# Patient Record
Sex: Female | Born: 1985
Health system: Southern US, Community
[De-identification: ages and names within clinical notes are randomized; demographics above are authoritative.]

## PROBLEM LIST (undated history)

## (undated) DIAGNOSIS — E559 Vitamin D deficiency, unspecified: Secondary | ICD-10-CM

## (undated) DIAGNOSIS — R519 Headache, unspecified: Secondary | ICD-10-CM

## (undated) DIAGNOSIS — G44011 Episodic cluster headache, intractable: Secondary | ICD-10-CM

## (undated) DIAGNOSIS — K219 Gastro-esophageal reflux disease without esophagitis: Secondary | ICD-10-CM

## (undated) DIAGNOSIS — N939 Abnormal uterine and vaginal bleeding, unspecified: Secondary | ICD-10-CM

## (undated) DIAGNOSIS — R5381 Other malaise: Secondary | ICD-10-CM

## (undated) DIAGNOSIS — N926 Irregular menstruation, unspecified: Secondary | ICD-10-CM

## (undated) DIAGNOSIS — G44311 Acute post-traumatic headache, intractable: Secondary | ICD-10-CM

## (undated) DIAGNOSIS — S73192A Other sprain of left hip, initial encounter: Secondary | ICD-10-CM

## (undated) DIAGNOSIS — G43909 Migraine, unspecified, not intractable, without status migrainosus: Secondary | ICD-10-CM

## (undated) DIAGNOSIS — I10 Essential (primary) hypertension: Secondary | ICD-10-CM

## (undated) DIAGNOSIS — R600 Localized edema: Secondary | ICD-10-CM

## (undated) DIAGNOSIS — E039 Hypothyroidism, unspecified: Secondary | ICD-10-CM

## (undated) DIAGNOSIS — R0683 Snoring: Secondary | ICD-10-CM

## (undated) DIAGNOSIS — E282 Polycystic ovarian syndrome: Secondary | ICD-10-CM

## (undated) DIAGNOSIS — M549 Dorsalgia, unspecified: Principal | ICD-10-CM

## (undated) DIAGNOSIS — G4733 Obstructive sleep apnea (adult) (pediatric): Secondary | ICD-10-CM

## (undated) HISTORY — DX: Other sprain of left hip, initial encounter: S73.192A

## (undated) HISTORY — DX: Migraine, unspecified, not intractable, without status migrainosus: G43.909

## (undated) HISTORY — DX: Essential (primary) hypertension: I10

## (undated) HISTORY — DX: Localized edema: R60.0

## (undated) HISTORY — DX: Gastro-esophageal reflux disease without esophagitis: K21.9

## (undated) HISTORY — DX: Hypothyroidism, unspecified: E03.9

## (undated) MED FILL — AMLODIPINE BESYLATE 10MG TABS: 10 MG | 30 days supply | Qty: 30 | Fill #0 | Status: AC

## (undated) MED FILL — FAMOTIDINE 20MG TABS: 20 MG | 90 days supply | Qty: 90 | Fill #0 | Status: AC

## (undated) MED FILL — METOPROLOL SUCCINATE ER 25MG TB24: 25 MG | 90 days supply | Qty: 90 | Fill #0 | Status: AC

## (undated) MED FILL — LEVOTHYROXINE SODIUM 100MCG TABS: 100 MCG | 90 days supply | Qty: 90 | Fill #0 | Status: AC

## (undated) MED FILL — METOPROLOL SUCCINATE ER 25MG TB24: 25 MG | 90 days supply | Qty: 90 | Fill #1 | Status: AC

## (undated) MED FILL — UBRELVY 100MG TABS: 100 MG | 19 days supply | Qty: 10 | Fill #0 | Status: AC

## (undated) MED FILL — AMLODIPINE BESYLATE 5MG TABS: 5 MG | 90 days supply | Qty: 90 | Fill #0 | Status: AC

## (undated) MED FILL — AMLODIPINE BESYLATE 5MG TABS: 5 MG | 90 days supply | Qty: 90 | Fill #1 | Status: AC

## (undated) MED FILL — FAMOTIDINE 20MG TABS: 20 MG | 90 days supply | Qty: 90 | Fill #1 | Status: AC

## (undated) MED FILL — MELOXICAM 15MG TABS: 15 MG | 30 days supply | Qty: 30 | Fill #0 | Status: AC

## (undated) MED FILL — RIZATRIPTAN BENZOATE ODT 10MG TBDP: 10 MG | 30 days supply | Qty: 18 | Fill #0 | Status: AC

## (undated) MED FILL — LEVOTHYROXINE SODIUM 25MCG TABS: 25 MCG | 60 days supply | Qty: 90 | Fill #0 | Status: AC

## (undated) MED FILL — UBRELVY 100MG TABS: 100 MG | 30 days supply | Qty: 16 | Fill #1 | Status: AC

## (undated) MED FILL — PANTOPRAZOLE SODIUM 40MG TBEC: 40 MG | 90 days supply | Qty: 90 | Fill #0 | Status: AC

## (undated) MED FILL — AMLODIPINE BESYLATE 10MG TABS: 10 MG | 90 days supply | Qty: 90 | Fill #0 | Status: AC

## (undated) MED FILL — PANTOPRAZOLE SODIUM 40MG TBEC: 40 MG | 30 days supply | Qty: 30 | Fill #0 | Status: AC

## (undated) MED FILL — LEVOTHYROXINE SODIUM 75MCG TABS: 75 MCG | 90 days supply | Qty: 90 | Fill #0 | Status: AC

## (undated) MED FILL — LEVOTHYROXINE SODIUM 25MCG TABS: 25 MCG | 30 days supply | Qty: 45 | Fill #0 | Status: AC

## (undated) MED FILL — LEVOTHYROXINE SODIUM 25MCG TABS: 25 MCG | 30 days supply | Qty: 45 | Fill #1 | Status: AC

---

## 2016-10-15 NOTE — Progress Notes (Signed)
Formatting of this note is different from the original.  Subjective:      Rebecca Lyons is a 31 y.o. female here for evaluation of a fever, chills, bilateral ear pain, rhinorrhea, headache, body ache, cough. Onset of symptoms was 4 days ago, her fever, body ache resolved already. She still has bad cough and ear pain. The cough is productive of clear sputum, with shortness of breath during the cough, chest is painful during coughing. Patient does not have a history of asthma. Patient has had recent travel, she just came back from [place] last night, she stayed there for 3 weeks. Patient does not have a history of smoking.   She denies chest pain, dyspnea, leg pain/swelling.    The following portions of the patient's history were reviewed and updated as appropriate: allergies, current medications, past family history, past medical history, past social history, past surgical history and problem list.  Active Ambulatory Problems     Diagnosis Date Noted   ? Acute bronchitis 07/16/2014   ? Preventative health care 09/25/2014   ? Palpitations 09/25/2014   ? Hair loss 09/25/2014     Resolved Ambulatory Problems     Diagnosis Date Noted   ? No Resolved Ambulatory Problems     No Additional Past Medical History   No current outpatient prescriptions on file.    No Known Allergies    Review of Systems  No dyspnea, chest pain.   No abdominal pain, nausea, diarrhea.  No dysuria, frequency.  No rash.       Objective:   BP 153/118 (BP Site: L Arm, BP Position: Sitting, BP Cuff Size: Medium)   Pulse 121   Temp 36.9 C (98.5 F) (Oral)   Resp 16   Ht 160 cm (5' 2.99")   Wt 69.9 kg (154 lb)   SpO2 98%   BMI 27.29 kg/m    BP Readings from Last 3 Encounters:   10/15/16 153/118   12/21/14 122/80   09/25/14 132/86     Pulse Readings from Last 3 Encounters:   10/15/16 121   12/21/14 112   09/25/14 116   she state 120 is her baseline pulse.     Physical Exam   Constitutional: She appears well-nourished. No distress.   HENT:   Left Ear:  Tympanic membrane is erythematous and bulging.   Mouth/Throat: No oropharyngeal exudate, posterior oropharyngeal edema or posterior oropharyngeal erythema.   Wax in right ear.    Eyes: Conjunctivae are normal. No scleral icterus.   Neck: Neck supple.   Cardiovascular: Normal rate and regular rhythm.    Pulmonary/Chest: Effort normal.   Abdominal: Soft.   Musculoskeletal: She exhibits no edema or tenderness.   Lymphadenopathy:     She has no cervical adenopathy.   Neurological: She is alert.   Skin: No rash noted. She is not diaphoretic. No erythema.   Psychiatric: She has a normal mood and affect. Her behavior is normal.       Assessment:     Acute Bronchitis    Possible otitis media    Plan:     ?  amoxicillin-clavulanate (AUGMENTIN) 875-125 mg per tablet, Take 1 tablet by mouth Two (2) times a day. for 10 days, Disp: 20 tablet, Rfl: 0  ?  benzonatate (TESSALON) 200 MG capsule, Take 1 capsule (200 mg total) by mouth Three (3) times a day as needed for cough., Disp: 30 capsule, Rfl: 0  ?  hydrocodone-chlorpheniramine polistirex (TUSSIONEX PENNKINETIC) 10-8 mg/5 mL ER  suspension, Take 5 mL by mouth every twelve (12) hours as needed for cough. for up to 7 days, Disp: 70 mL, Rfl: 0  Explained lack of efficacy of antibiotics in viral disease, she will hold Augmentin for now, start if ear pain get worse.   Avoid exposure to tobacco smoke and fumes.  Call if shortness of breath worsens, blood in sputum, change in character of cough, development of fever or chills, inability to maintain nutrition and hydration  Follow up in 2-3 days if not better      Electronically signed by Margrett Rud, PAC at 10/15/2016 10:36 AM EST

## 2017-10-09 DIAGNOSIS — R030 Elevated blood-pressure reading, without diagnosis of hypertension: Secondary | ICD-10-CM | POA: Diagnosis not present

## 2017-10-09 DIAGNOSIS — G43009 Migraine without aura, not intractable, without status migrainosus: Secondary | ICD-10-CM | POA: Diagnosis not present

## 2017-10-14 DIAGNOSIS — J101 Influenza due to other identified influenza virus with other respiratory manifestations: Secondary | ICD-10-CM | POA: Diagnosis not present

## 2017-10-17 DIAGNOSIS — R002 Palpitations: Secondary | ICD-10-CM | POA: Diagnosis not present

## 2017-10-17 DIAGNOSIS — G43011 Migraine without aura, intractable, with status migrainosus: Secondary | ICD-10-CM | POA: Diagnosis not present

## 2017-10-17 DIAGNOSIS — J111 Influenza due to unidentified influenza virus with other respiratory manifestations: Secondary | ICD-10-CM | POA: Diagnosis not present

## 2017-11-01 DIAGNOSIS — J209 Acute bronchitis, unspecified: Secondary | ICD-10-CM | POA: Diagnosis not present

## 2017-11-01 DIAGNOSIS — Z8709 Personal history of other diseases of the respiratory system: Secondary | ICD-10-CM | POA: Diagnosis not present

## 2017-11-01 DIAGNOSIS — R0781 Pleurodynia: Secondary | ICD-10-CM | POA: Diagnosis not present

## 2017-11-01 DIAGNOSIS — R05 Cough: Secondary | ICD-10-CM | POA: Diagnosis not present

## 2017-11-01 DIAGNOSIS — R079 Chest pain, unspecified: Secondary | ICD-10-CM | POA: Diagnosis not present

## 2017-11-01 DIAGNOSIS — J14 Pneumonia due to Hemophilus influenzae: Secondary | ICD-10-CM | POA: Diagnosis not present

## 2017-11-01 DIAGNOSIS — J9801 Acute bronchospasm: Secondary | ICD-10-CM | POA: Diagnosis not present

## 2017-12-17 MED FILL — METOPROLOL SUCCINATE ER 50: 50 | 30 days supply | Qty: 30 | Fill #0

## 2019-02-24 DIAGNOSIS — M25552 Pain in left hip: Secondary | ICD-10-CM | POA: Diagnosis not present

## 2019-03-11 DIAGNOSIS — M25552 Pain in left hip: Secondary | ICD-10-CM | POA: Diagnosis not present

## 2019-03-12 DIAGNOSIS — M25552 Pain in left hip: Secondary | ICD-10-CM | POA: Diagnosis not present

## 2019-05-12 DIAGNOSIS — M6281 Muscle weakness (generalized): Secondary | ICD-10-CM | POA: Diagnosis not present

## 2019-05-12 DIAGNOSIS — M25552 Pain in left hip: Secondary | ICD-10-CM | POA: Diagnosis not present

## 2019-05-12 DIAGNOSIS — S76012D Strain of muscle, fascia and tendon of left hip, subsequent encounter: Secondary | ICD-10-CM | POA: Diagnosis not present

## 2019-05-13 MED FILL — AMOXICILLIN 500 MG CAPSULE: 500 | 7 days supply | Qty: 21 | Fill #0

## 2019-05-16 DIAGNOSIS — M25552 Pain in left hip: Secondary | ICD-10-CM | POA: Diagnosis not present

## 2019-05-16 DIAGNOSIS — M6281 Muscle weakness (generalized): Secondary | ICD-10-CM | POA: Diagnosis not present

## 2019-05-16 DIAGNOSIS — S76012D Strain of muscle, fascia and tendon of left hip, subsequent encounter: Secondary | ICD-10-CM | POA: Diagnosis not present

## 2019-05-20 DIAGNOSIS — M25552 Pain in left hip: Secondary | ICD-10-CM | POA: Diagnosis not present

## 2019-05-20 DIAGNOSIS — S76012D Strain of muscle, fascia and tendon of left hip, subsequent encounter: Secondary | ICD-10-CM | POA: Diagnosis not present

## 2019-05-20 DIAGNOSIS — M6281 Muscle weakness (generalized): Secondary | ICD-10-CM | POA: Diagnosis not present

## 2019-05-21 DIAGNOSIS — S76012D Strain of muscle, fascia and tendon of left hip, subsequent encounter: Secondary | ICD-10-CM | POA: Diagnosis not present

## 2019-05-21 DIAGNOSIS — M25552 Pain in left hip: Secondary | ICD-10-CM | POA: Diagnosis not present

## 2019-05-21 DIAGNOSIS — M6281 Muscle weakness (generalized): Secondary | ICD-10-CM | POA: Diagnosis not present

## 2019-05-26 DIAGNOSIS — M25552 Pain in left hip: Secondary | ICD-10-CM | POA: Diagnosis not present

## 2019-05-26 DIAGNOSIS — S76012D Strain of muscle, fascia and tendon of left hip, subsequent encounter: Secondary | ICD-10-CM | POA: Diagnosis not present

## 2019-05-26 DIAGNOSIS — M6281 Muscle weakness (generalized): Secondary | ICD-10-CM | POA: Diagnosis not present

## 2019-05-28 DIAGNOSIS — M6281 Muscle weakness (generalized): Secondary | ICD-10-CM | POA: Diagnosis not present

## 2019-05-28 DIAGNOSIS — M25552 Pain in left hip: Secondary | ICD-10-CM | POA: Diagnosis not present

## 2019-05-28 DIAGNOSIS — S76012D Strain of muscle, fascia and tendon of left hip, subsequent encounter: Secondary | ICD-10-CM | POA: Diagnosis not present

## 2019-06-06 DIAGNOSIS — M6281 Muscle weakness (generalized): Secondary | ICD-10-CM | POA: Diagnosis not present

## 2019-06-06 DIAGNOSIS — S76012D Strain of muscle, fascia and tendon of left hip, subsequent encounter: Secondary | ICD-10-CM | POA: Diagnosis not present

## 2019-06-06 DIAGNOSIS — M25552 Pain in left hip: Secondary | ICD-10-CM | POA: Diagnosis not present

## 2019-06-10 DIAGNOSIS — M25552 Pain in left hip: Secondary | ICD-10-CM | POA: Diagnosis not present

## 2019-06-10 DIAGNOSIS — M6281 Muscle weakness (generalized): Secondary | ICD-10-CM | POA: Diagnosis not present

## 2019-06-10 DIAGNOSIS — S76012D Strain of muscle, fascia and tendon of left hip, subsequent encounter: Secondary | ICD-10-CM | POA: Diagnosis not present

## 2019-06-18 DIAGNOSIS — S76012D Strain of muscle, fascia and tendon of left hip, subsequent encounter: Secondary | ICD-10-CM | POA: Diagnosis not present

## 2019-06-18 DIAGNOSIS — M25552 Pain in left hip: Secondary | ICD-10-CM | POA: Diagnosis not present

## 2019-06-18 DIAGNOSIS — M6281 Muscle weakness (generalized): Secondary | ICD-10-CM | POA: Diagnosis not present

## 2019-06-19 DIAGNOSIS — M25552 Pain in left hip: Secondary | ICD-10-CM | POA: Diagnosis not present

## 2019-06-19 DIAGNOSIS — M6281 Muscle weakness (generalized): Secondary | ICD-10-CM | POA: Diagnosis not present

## 2019-06-19 DIAGNOSIS — S76012D Strain of muscle, fascia and tendon of left hip, subsequent encounter: Secondary | ICD-10-CM | POA: Diagnosis not present

## 2019-06-23 DIAGNOSIS — S76012D Strain of muscle, fascia and tendon of left hip, subsequent encounter: Secondary | ICD-10-CM | POA: Diagnosis not present

## 2019-06-23 DIAGNOSIS — M25552 Pain in left hip: Secondary | ICD-10-CM | POA: Diagnosis not present

## 2019-06-23 DIAGNOSIS — M6281 Muscle weakness (generalized): Secondary | ICD-10-CM | POA: Diagnosis not present

## 2019-06-24 DIAGNOSIS — M6281 Muscle weakness (generalized): Secondary | ICD-10-CM | POA: Diagnosis not present

## 2019-06-24 DIAGNOSIS — M25552 Pain in left hip: Secondary | ICD-10-CM | POA: Diagnosis not present

## 2019-06-24 DIAGNOSIS — S76012D Strain of muscle, fascia and tendon of left hip, subsequent encounter: Secondary | ICD-10-CM | POA: Diagnosis not present

## 2019-06-24 MED FILL — AMOXICILLIN 500 MG CAPSULE: 500 | 7 days supply | Qty: 21 | Fill #0

## 2019-06-30 DIAGNOSIS — M25552 Pain in left hip: Secondary | ICD-10-CM | POA: Diagnosis not present

## 2019-06-30 DIAGNOSIS — M6281 Muscle weakness (generalized): Secondary | ICD-10-CM | POA: Diagnosis not present

## 2019-06-30 DIAGNOSIS — S76012D Strain of muscle, fascia and tendon of left hip, subsequent encounter: Secondary | ICD-10-CM | POA: Diagnosis not present

## 2019-07-01 ENCOUNTER — Other Ambulatory Visit: Payer: Self-pay

## 2019-07-01 ENCOUNTER — Ambulatory Visit: Payer: 59 | Admitting: Medical

## 2019-07-01 ENCOUNTER — Encounter: Payer: Self-pay | Admitting: Medical

## 2019-07-01 VITALS — BP 170/110 | HR 139 | Temp 98.0°F | Resp 16 | Ht 63.0 in | Wt 178.6 lb

## 2019-07-01 DIAGNOSIS — M25559 Pain in unspecified hip: Secondary | ICD-10-CM | POA: Diagnosis not present

## 2019-07-01 DIAGNOSIS — I10 Essential (primary) hypertension: Secondary | ICD-10-CM

## 2019-07-01 LAB — COMPREHENSIVE METABOLIC PANEL
ALT: 20 U/L (ref 0–35)
AST: 18 U/L (ref 0–37)
Albumin: 4.7 g/dL (ref 3.5–5.2)
Alkaline Phosphatase: 43 U/L (ref 39–117)
BUN: 10 mg/dL (ref 6–23)
CO2: 28 mEq/L (ref 19–32)
Calcium: 9.9 mg/dL (ref 8.4–10.5)
Chloride: 102 mEq/L (ref 96–112)
Creatinine, Ser: 0.76 mg/dL (ref 0.40–1.20)
GFR: 88.06 mL/min (ref >60.00)
Glucose, Bld: 122 mg/dL — ABNORMAL HIGH (ref 70–99)
Potassium: 4.3 mEq/L (ref 3.5–5.1)
Sodium: 138 mEq/L (ref 135–145)
Total Bilirubin: 0.5 mg/dL (ref 0.2–1.2)
Total Protein: 7.1 g/dL (ref 6.0–8.3)

## 2019-07-01 MED ORDER — METOPROLOL SUCCINATE ER 50 MG PO TB24: 50.0000 mg | ORAL_TABLET | Freq: Every day | ORAL | 0 refills | Status: DC

## 2019-07-01 MED FILL — METOPROLOL SUCCINATE ER 50: 50 | 90 days supply | Qty: 90 | Fill #0

## 2019-07-01 NOTE — Progress Notes (Signed)
Subjective:    Patient ID: Tracy Arnold, female    DOB: [birth year], 33 y.o.   MRN: [id number]  HPI  Pt in for first time.   Pt was seeing other provider through [facility]. Now with [facility] [employer] switching.  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  Pt was exercising regularly until she fell off ladder in June. Had labral tear and since then has been doing low impact exercise and PT.  Pt states eats somewhat healthy. She states work has been high.   History of intermittent high blood pressure spikes in past. In past with exercise and diet her blood pressure got under control. At one point she was on low dose metroprolol 50 mg. No gross motor or sensory functions deficits. Pt is taking some motrin periodically. Pt expresses reluctance to be on med but realizes may need to be on med. When she checks her bp at home 150/90 range.  Exercise helps her a lot. She was eating better, sleeping better, had less stress and bp was better.  LMP-  Sept 23, 2020.   Review of Systems  Constitutional: Negative for chills, fatigue and fever.  HENT: Negative for congestion, ear discharge, facial swelling, rhinorrhea and sinus pressure.   Respiratory: Negative for chest tightness, shortness of breath and wheezing.   Cardiovascular: Negative for chest pain and palpitations.  Gastrointestinal: Negative for abdominal pain.  Musculoskeletal: Negative for gait problem.       Left hip pain history.  Skin: Negative for rash.  Neurological: Negative for dizziness, speech difficulty, weakness and light-headedness.  Hematological: Negative for adenopathy. Does not bruise/bleed easily.  Psychiatric/Behavioral: Negative for behavioral problems, confusion and hallucinations. The patient is not nervous/anxious.        Stress associated with job.   Past Medical History:  Diagnosis Date  . Hypertension    borderline in past.     Social History   Socioeconomic History  .  Marital status: Single    Spouse name: Not on file  . Number of children: Not on file  . Years of education: Not on file  . Highest education level: Not on file  Occupational History  . Not on file  Social Needs  . Financial resource strain: Not on file  . Food insecurity    Worry: Not on file    Inability: Not on file  . Transportation needs    Medical: Not on file    Non-medical: Not on file  Tobacco Use  . Smoking status: Never Smoker  . Smokeless tobacco: Never Used  Substance and Sexual Activity  . Alcohol use: Never    Frequency: Never  . Drug use: Never  . Sexual activity: Not on file  Lifestyle  . Physical activity    Days per week: Not on file    Minutes per session: Not on file  . Stress: Not on file  Relationships  . Social Musician on phone: Not on file    Gets together: Not on file    Attends religious service: Not on file    Active member of club or organization: Not on file    Attends meetings of clubs or organizations: Not on file    Relationship status: Not on file  . Intimate partner violence    Fear of current or ex partner: Not on file    Emotionally abused: Not on file    Physically abused: Not on file  Forced sexual activity: Not on file  Other Topics Concern  . Not on file  Social History Narrative  . Not on file    History reviewed. No pertinent surgical history.  Family History  Problem Relation Age of Onset  . Diabetes Father   . Hypertension Father     Not on File  Current Outpatient Medications on File Prior to Visit  Medication Sig Dispense Refill  . amoxicillin (AMOXIL) 500 MG capsule      No current facility-administered medications on file prior to visit.     BP (!) 168/120   Pulse (!) 139   Temp 98 F (36.7 C) (Temporal)   Resp 16   Ht 5\' 3"  (1.6 m)   Wt 178 lb 9.6 oz (81 kg)   SpO2 100%   BMI 31.64 kg/m       Objective:   Physical Exam  General Mental Status- Alert. General Appearance- Not  in acute distress.   Skin General: Color- Normal Color. Moisture- Normal Moisture.  Neck Carotid Arteries- Normal color. Moisture- Normal Moisture. No carotid bruits. No JVD.  Chest and Lung Exam Auscultation: Breath Sounds:-Normal.  Cardiovascular Auscultation:Rythm- Regular. Murmurs & Other Heart Sounds:Auscultation of the heart reveals- No Murmurs.  Abdomen Inspection:-Inspeection Normal. Palpation/Percussion:Note:No mass. Palpation and Percussion of the abdomen reveal- Non Tender, Non Distended + BS, no rebound or guarding.  Neurologic Cranial Nerve exam:- CN III-XII intact(No nystagmus), symmetric smile. Strength:- 5/5 equal and symmetric strength both upper and lower extremities. Finger to nose intact. No hand drift.      Assessment & Plan:  For your elevated bp would recommend restarting metroprolol 50 mg ER. Continue to exercise but caution on intensity and type due to left hip pain. Follow advise of PT.  With elevated bp if you get any neurologic signs/symptoms notify us. Severe then ED evaluation.  Recommend eat healthy.  Request that you follow up in 2 weeks virtual visit with log on daily bp readings.  Also can schedule cpe/wellness exam to get fasting labs.   Mackie Pai, PA-C

## 2019-07-01 NOTE — Patient Instructions (Addendum)
For your elevated bp would recommend restarting metroprolol 50 mg ER. Continue to exercise but caution on intensity and type due to left hip pain. Follow advise of PT.  With elevated bp if you get any neurologic signs/symptoms notify us. Severe then ED evaluation.  Recommend eat healthy.  Request that you follow up in 2 weeks virtual visit with log on daily bp readings.  Also can schedule cpe/wellness exam to get fasting labs.

## 2019-07-02 DIAGNOSIS — M25552 Pain in left hip: Secondary | ICD-10-CM | POA: Diagnosis not present

## 2019-07-02 DIAGNOSIS — S76012D Strain of muscle, fascia and tendon of left hip, subsequent encounter: Secondary | ICD-10-CM | POA: Diagnosis not present

## 2019-07-02 DIAGNOSIS — M6281 Muscle weakness (generalized): Secondary | ICD-10-CM | POA: Diagnosis not present

## 2019-07-07 DIAGNOSIS — M25552 Pain in left hip: Secondary | ICD-10-CM | POA: Diagnosis not present

## 2019-07-07 DIAGNOSIS — M6281 Muscle weakness (generalized): Secondary | ICD-10-CM | POA: Diagnosis not present

## 2019-07-07 DIAGNOSIS — S76012D Strain of muscle, fascia and tendon of left hip, subsequent encounter: Secondary | ICD-10-CM | POA: Diagnosis not present

## 2019-07-08 DIAGNOSIS — M6281 Muscle weakness (generalized): Secondary | ICD-10-CM | POA: Diagnosis not present

## 2019-07-08 DIAGNOSIS — S76012D Strain of muscle, fascia and tendon of left hip, subsequent encounter: Secondary | ICD-10-CM | POA: Diagnosis not present

## 2019-07-08 DIAGNOSIS — M25552 Pain in left hip: Secondary | ICD-10-CM | POA: Diagnosis not present

## 2019-07-18 DIAGNOSIS — M6281 Muscle weakness (generalized): Secondary | ICD-10-CM | POA: Diagnosis not present

## 2019-07-18 DIAGNOSIS — S76012D Strain of muscle, fascia and tendon of left hip, subsequent encounter: Secondary | ICD-10-CM | POA: Diagnosis not present

## 2019-07-18 DIAGNOSIS — M25552 Pain in left hip: Secondary | ICD-10-CM | POA: Diagnosis not present

## 2019-07-22 ENCOUNTER — Other Ambulatory Visit: Payer: Self-pay

## 2019-07-22 ENCOUNTER — Telehealth: Payer: Self-pay

## 2019-07-22 MED FILL — LORazepam 2 MG TABS: 2 | 1 days supply | Qty: 1 | Fill #0

## 2019-07-22 MED FILL — HYDROCODON-APAP 5-325: 5-325 | 2 days supply | Qty: 8 | Fill #0

## 2019-07-22 MED FILL — ONDANSETRON ODT 4 MG TABLET: 4 | 2 days supply | Qty: 6 | Fill #0

## 2019-07-22 MED FILL — AMOXICILLIN 250 MG CAPSULE: 250 | 5 days supply | Qty: 15 | Fill #0

## 2019-07-22 NOTE — Telephone Encounter (Signed)
Copied from [place] [id number]. Topic: General - Other >> Jul 22, 2019  2:02 PM Keene Breath wrote: Reason for CRM: Patient is returning a screening call.  Tried office but they were busy.  Please call patient back at [phone]

## 2019-07-22 NOTE — Telephone Encounter (Signed)
Called and screened

## 2019-07-23 ENCOUNTER — Ambulatory Visit (INDEPENDENT_AMBULATORY_CARE_PROVIDER_SITE_OTHER): Payer: 59 | Admitting: Medical

## 2019-07-23 ENCOUNTER — Other Ambulatory Visit: Payer: Self-pay

## 2019-07-23 ENCOUNTER — Encounter: Payer: Self-pay | Admitting: Medical

## 2019-07-23 VITALS — BP 150/90 | HR 94 | Temp 98.1°F | Resp 16 | Ht 63.0 in | Wt 179.0 lb

## 2019-07-23 DIAGNOSIS — R739 Hyperglycemia, unspecified: Secondary | ICD-10-CM | POA: Diagnosis not present

## 2019-07-23 DIAGNOSIS — R5383 Other fatigue: Secondary | ICD-10-CM | POA: Diagnosis not present

## 2019-07-23 DIAGNOSIS — Z0000 Encounter for general adult medical examination without abnormal findings: Secondary | ICD-10-CM | POA: Diagnosis not present

## 2019-07-23 DIAGNOSIS — Z113 Encounter for screening for infections with a predominantly sexual mode of transmission: Secondary | ICD-10-CM

## 2019-07-23 DIAGNOSIS — Z23 Encounter for immunization: Secondary | ICD-10-CM | POA: Diagnosis not present

## 2019-07-23 LAB — COMPREHENSIVE METABOLIC PANEL
ALT: 12 U/L (ref 0–35)
AST: 12 U/L (ref 0–37)
Albumin: 4.6 g/dL (ref 3.5–5.2)
Alkaline Phosphatase: 39 U/L (ref 39–117)
BUN: 13 mg/dL (ref 6–23)
CO2: 26 mEq/L (ref 19–32)
Calcium: 9.4 mg/dL (ref 8.4–10.5)
Chloride: 105 mEq/L (ref 96–112)
Creatinine, Ser: 0.82 mg/dL (ref 0.40–1.20)
GFR: 80.64 mL/min (ref >60.00)
Glucose, Bld: 107 mg/dL — ABNORMAL HIGH (ref 70–99)
Potassium: 4.5 mEq/L (ref 3.5–5.1)
Sodium: 138 mEq/L (ref 135–145)
Total Bilirubin: 0.4 mg/dL (ref 0.2–1.2)
Total Protein: 7 g/dL (ref 6.0–8.3)

## 2019-07-23 LAB — CBC WITH DIFFERENTIAL/PLATELET
Basophils Absolute: 0 10*3/uL (ref 0.0–0.1)
Basophils Relative: 0.6 % (ref 0.0–3.0)
Eosinophils Absolute: 0.1 10*3/uL (ref 0.0–0.7)
Eosinophils Relative: 2.3 % (ref 0.0–5.0)
HCT: 39.4 % (ref 36.0–46.0)
Hemoglobin: 13 g/dL (ref 12.0–15.0)
Lymphocytes Relative: 35.7 % (ref 12.0–46.0)
Lymphs Abs: 1.5 10*3/uL (ref 0.7–4.0)
MCHC: 33.1 g/dL (ref 30.0–36.0)
MCV: 82 fl (ref 78.0–100.0)
Monocytes Absolute: 0.2 10*3/uL (ref 0.1–1.0)
Monocytes Relative: 5 % (ref 3.0–12.0)
Neutro Abs: 2.4 10*3/uL (ref 1.4–7.7)
Neutrophils Relative %: 56.4 % (ref 43.0–77.0)
Platelets: 227 10*3/uL (ref 150.0–400.0)
RBC: 4.8 Mil/uL (ref 3.87–5.11)
RDW: 14.1 % (ref 11.5–15.5)
WBC: 4.2 10*3/uL (ref 4.0–10.5)

## 2019-07-23 LAB — LIPID PANEL
Cholesterol: 180 mg/dL (ref 0–200)
HDL: 64.1 mg/dL (ref >39.00)
LDL Cholesterol: 98 mg/dL (ref 0–99)
NonHDL: 115.59
Total CHOL/HDL Ratio: 3
Triglycerides: 88 mg/dL (ref 0.0–149.0)
VLDL: 17.6 mg/dL (ref 0.0–40.0)

## 2019-07-23 LAB — TSH: TSH: 5.32 u[IU]/mL — ABNORMAL HIGH (ref 0.35–4.50)

## 2019-07-23 LAB — HEMOGLOBIN A1C: Hgb A1c MFr Bld: 5.2 % (ref 4.6–6.5)

## 2019-07-23 NOTE — Progress Notes (Signed)
Subjective:    Patient ID: Tracy Arnold, female    DOB: 1986/06/02, 33 y.o.   MRN: [id number]  HPI  Pt in for wellness exam/cpe.  Pt is fasting today.   Pt up to date on flu vaccine. Pt not sure about her last tdap. She wants update today.  Last pap 2 years ago and was normal.  Pt has htn. On metoprolol. Pt has been checking her blood pressure at home and she states systolic 130/85 range recently. Checking once a day in the morning. Yesterday at the dentist was about the same.  Sleeping better since last visit. Pt still doing PT. She is doing modified work out.    Review of Systems  Constitutional: Positive for fatigue. Negative for chills and fever.       Mild fatigue at time. Also fh low thyroid on mom side.  HENT: Negative for congestion, ear pain, mouth sores and postnasal drip.   Respiratory: Negative for cough, chest tightness, shortness of breath and wheezing.   Cardiovascular: Negative for chest pain and palpitations.  Gastrointestinal: Negative for abdominal pain.  Musculoskeletal: Negative for back pain and neck pain.  Skin: Negative for rash.  Neurological: Negative for dizziness, seizures, weakness, light-headedness and numbness.  Hematological: Negative for adenopathy. Does not bruise/bleed easily.  Psychiatric/Behavioral: Negative for behavioral problems and decreased concentration.    Past Medical History:  Diagnosis Date  . Hypertension    borderline in past.     Social History   Socioeconomic History  . Marital status: Single    Spouse name: Not on file  . Number of children: Not on file  . Years of education: Not on file  . Highest education level: Not on file  Occupational History  . Not on file  Social Needs  . Financial resource strain: Not on file  . Food insecurity    Worry: Not on file    Inability: Not on file  . Transportation needs    Medical: Not on file    Non-medical: Not on file  Tobacco Use  . Smoking status: Never Smoker   . Smokeless tobacco: Never Used  Substance and Sexual Activity  . Alcohol use: Never    Frequency: Never  . Drug use: Never  . Sexual activity: Not on file  Lifestyle  . Physical activity    Days per week: Not on file    Minutes per session: Not on file  . Stress: Not on file  Relationships  . Social Musician on phone: Not on file    Gets together: Not on file    Attends religious service: Not on file    Active member of club or organization: Not on file    Attends meetings of clubs or organizations: Not on file    Relationship status: Not on file  . Intimate partner violence    Fear of current or ex partner: Not on file    Emotionally abused: Not on file    Physically abused: Not on file    Forced sexual activity: Not on file  Other Topics Concern  . Not on file  Social History Narrative  . Not on file    No past surgical history on file.  Family History  Problem Relation Age of Onset  . Diabetes Father   . Hypertension Father     Not on File  Current Outpatient Medications on File Prior to Visit  Medication Sig Dispense Refill  . metoprolol  succinate (TOPROL-XL) 50 MG 24 hr tablet Take 1 tablet (50 mg total) by mouth daily. Take with or immediately following a meal. 90 tablet 0   No current facility-administered medications on file prior to visit.     BP (!) 161/109   Pulse 94   Temp 98.1 F (36.7 C) (Temporal)   Resp 16   Ht 5\' 3"  (1.6 m)   Wt 179 lb (81.2 kg)   SpO2 100%   BMI 31.71 kg/m       Objective:   Physical Exam  General Mental Status- Alert. General Appearance- Not in acute distress.   Skin General: Color- Normal Color. Moisture- Normal Moisture.  Neck Carotid Arteries- Normal color. Moisture- Normal Moisture. No carotid bruits. No JVD.  Chest and Lung Exam Auscultation: Breath Sounds:-Normal.  Cardiovascular Auscultation:Rythm- Regular. Murmurs & Other Heart Sounds:Auscultation of the heart reveals- No Murmurs.   Abdomen Inspection:-Inspeection Normal. Palpation/Percussion:Note:No mass. Palpation and Percussion of the abdomen reveal- Non Tender, Non Distended + BS, no rebound or guarding.  Neurologic Cranial Nerve exam:- CN III-XII intact(No nystagmus). Drift Test:- No drift. Finger to Nose:- Normal/Intact Strength:- 5/5 equal and symmetric strength both upper and lower extremities.      Assessment & Plan:  For you wellness exam today I have ordered cbc, cmp, lipid panel and hiv.  Tdap given today.  Recommend exercise and healthy diet.  We will let you know lab results as they come in.  Your bp is still elevated here but better than before. BP reading at home better than here. Would ask that you get manual reading from coworker at work when relaxed and update me. I think would add either low dose losartan or low dose amlodipine if bp higher than 140/90.  Follow up date appointment will be determined after lab review.   Mackie Pai, PA-C

## 2019-07-23 NOTE — Addendum Note (Signed)
Addended by: Hinton Dyer on: 07/23/2019 09:24 AM   Modules accepted: Orders

## 2019-07-23 NOTE — Patient Instructions (Addendum)
For you wellness exam today I have ordered cbc, cmp, lipid panel and hiv.  Tdap given today.  Recommend exercise and healthy diet.  We will let you know lab results as they come in.  Your bp is still elevated here but better than before. BP reading at home better than here. Would ask that you get manual reading from coworker at work when relaxed and update me. I think would add either low dose losartan or low dose amlodipine if bp higher than 140/90.  Follow up date appointment will be determined after lab review.     Preventive Care 47-34 Years Old, Female Preventive care refers to visits with your health care provider and lifestyle choices that can promote health and wellness. This includes:  A yearly physical exam. This may also be called an annual well check.  Regular dental visits and eye exams.  Immunizations.  Screening for certain conditions.  Healthy lifestyle choices, such as eating a healthy diet, getting regular exercise, not using drugs or products that contain nicotine and tobacco, and limiting alcohol use. What can I expect for my preventive care visit? Physical exam Your health care provider will check your:  Height and weight. This may be used to calculate body mass index (BMI), which tells if you are at a healthy weight.  Heart rate and blood pressure.  Skin for abnormal spots. Counseling Your health care provider may ask you questions about your:  Alcohol, tobacco, and drug use.  Emotional well-being.  Home and relationship well-being.  Sexual activity.  Eating habits.  Work and work Statistician.  Method of birth control.  Menstrual cycle.  Pregnancy history. What immunizations do I need?  Influenza (flu) vaccine  This is recommended every year. Tetanus, diphtheria, and pertussis (Tdap) vaccine  You may need a Td booster every 10 years. Varicella (chickenpox) vaccine  You may need this if you have not been vaccinated. Human  papillomavirus (HPV) vaccine  If recommended by your health care provider, you may need three doses over 6 months. Measles, mumps, and rubella (MMR) vaccine  You may need at least one dose of MMR. You may also need a second dose. Meningococcal conjugate (MenACWY) vaccine  One dose is recommended if you are age 60-21 years and a first-year college student living in a residence hall, or if you have one of several medical conditions. You may also need additional booster doses. Pneumococcal conjugate (PCV13) vaccine  You may need this if you have certain conditions and were not previously vaccinated. Pneumococcal polysaccharide (PPSV23) vaccine  You may need one or two doses if you smoke cigarettes or if you have certain conditions. Hepatitis A vaccine  You may need this if you have certain conditions or if you travel or work in places where you may be exposed to hepatitis A. Hepatitis B vaccine  You may need this if you have certain conditions or if you travel or work in places where you may be exposed to hepatitis B. Haemophilus influenzae type b (Hib) vaccine  You may need this if you have certain conditions. You may receive vaccines as individual doses or as more than one vaccine together in one shot (combination vaccines). Talk with your health care provider about the risks and benefits of combination vaccines. What tests do I need?  Blood tests  Lipid and cholesterol levels. These may be checked every 5 years starting at age 76.  Hepatitis C test.  Hepatitis B test. Screening  Diabetes screening. This is done  by checking your blood sugar (glucose) after you have not eaten for a while (fasting).  Sexually transmitted disease (STD) testing.  BRCA-related cancer screening. This may be done if you have a family history of breast, ovarian, tubal, or peritoneal cancers.  Pelvic exam and Pap test. This may be done every 3 years starting at age 58. Starting at age 23, this may be  done every 5 years if you have a Pap test in combination with an HPV test. Talk with your health care provider about your test results, treatment options, and if necessary, the need for more tests. Follow these instructions at home: Eating and drinking   Eat a diet that includes fresh fruits and vegetables, whole grains, lean protein, and low-fat dairy.  Take vitamin and mineral supplements as recommended by your health care provider.  Do not drink alcohol if: ? Your health care provider tells you not to drink. ? You are pregnant, may be pregnant, or are planning to become pregnant.  If you drink alcohol: ? Limit how much you have to 0-1 drink a day. ? Be aware of how much alcohol is in your drink. In the [place] one drink equals one 12 oz bottle of beer (355 mL), one 5 oz glass of wine (148 mL), or one 1 oz glass of hard liquor (44 mL). Lifestyle  Take daily care of your teeth and gums.  Stay active. Exercise for at least 30 minutes on 5 or more days each week.  Do not use any products that contain nicotine or tobacco, such as cigarettes, e-cigarettes, and chewing tobacco. If you need help quitting, ask your health care provider.  If you are sexually active, practice safe sex. Use a condom or other form of birth control (contraception) in order to prevent pregnancy and STIs (sexually transmitted infections). If you plan to become pregnant, see your health care provider for a preconception visit. What's next?  Visit your health care provider once a year for a well check visit.  Ask your health care provider how often you should have your eyes and teeth checked.  Stay up to date on all vaccines. This information is not intended to replace advice given to you by your health care provider. Make sure you discuss any questions you have with your health care provider. Document Released: 10/24/2001 Document Revised: 05/09/2018 Document Reviewed: 05/09/2018 Elsevier Patient Education  2020  [employer].

## 2019-07-24 ENCOUNTER — Other Ambulatory Visit (INDEPENDENT_AMBULATORY_CARE_PROVIDER_SITE_OTHER): Payer: 59

## 2019-07-24 ENCOUNTER — Other Ambulatory Visit: Payer: Self-pay | Admitting: Emergency Medicine

## 2019-07-24 DIAGNOSIS — M6281 Muscle weakness (generalized): Secondary | ICD-10-CM | POA: Diagnosis not present

## 2019-07-24 DIAGNOSIS — R7989 Other specified abnormal findings of blood chemistry: Secondary | ICD-10-CM

## 2019-07-24 DIAGNOSIS — S76012D Strain of muscle, fascia and tendon of left hip, subsequent encounter: Secondary | ICD-10-CM | POA: Diagnosis not present

## 2019-07-24 DIAGNOSIS — M25552 Pain in left hip: Secondary | ICD-10-CM | POA: Diagnosis not present

## 2019-07-24 LAB — HIV ANTIBODY (ROUTINE TESTING W REFLEX): HIV 1&2 Ab, 4th Generation: NONREACTIVE

## 2019-07-24 LAB — T4, FREE: Free T4: 0.79 ng/dL (ref 0.60–1.60)

## 2019-07-25 DIAGNOSIS — M6281 Muscle weakness (generalized): Secondary | ICD-10-CM | POA: Diagnosis not present

## 2019-07-25 DIAGNOSIS — M25552 Pain in left hip: Secondary | ICD-10-CM | POA: Diagnosis not present

## 2019-07-25 DIAGNOSIS — S76012D Strain of muscle, fascia and tendon of left hip, subsequent encounter: Secondary | ICD-10-CM | POA: Diagnosis not present

## 2019-07-25 LAB — THYROID PEROXIDASE ANTIBODY: Thyroperoxidase Ab SerPl-aCnc: 458 IU/mL — ABNORMAL HIGH (ref <9)

## 2019-07-28 DIAGNOSIS — M6281 Muscle weakness (generalized): Secondary | ICD-10-CM | POA: Diagnosis not present

## 2019-07-28 DIAGNOSIS — S76012D Strain of muscle, fascia and tendon of left hip, subsequent encounter: Secondary | ICD-10-CM | POA: Diagnosis not present

## 2019-07-28 DIAGNOSIS — M25552 Pain in left hip: Secondary | ICD-10-CM | POA: Diagnosis not present

## 2019-08-13 DIAGNOSIS — M25552 Pain in left hip: Secondary | ICD-10-CM | POA: Diagnosis not present

## 2019-08-13 DIAGNOSIS — S76012D Strain of muscle, fascia and tendon of left hip, subsequent encounter: Secondary | ICD-10-CM | POA: Diagnosis not present

## 2019-08-13 DIAGNOSIS — M6281 Muscle weakness (generalized): Secondary | ICD-10-CM | POA: Diagnosis not present

## 2019-08-24 ENCOUNTER — Encounter: Payer: Self-pay | Admitting: Medical

## 2019-08-25 ENCOUNTER — Telehealth: Payer: Self-pay | Admitting: Medical

## 2019-08-25 MED ORDER — HYDROCHLOROTHIAZIDE 12.5 MG PO CAPS: 12.5000 mg | ORAL_CAPSULE | Freq: Every day | ORAL | 3 refills | Status: DC

## 2019-08-25 MED FILL — HYDROCHLOROTHIAZIDE 12.5 MG: 12.5 | 90 days supply | Qty: 90 | Fill #0

## 2019-08-25 NOTE — Telephone Encounter (Signed)
Rx hctz sent to pt pharmacy.

## 2019-09-01 MED FILL — HYDROCODON-APAP 5-325: 5-325 | 2 days supply | Qty: 8 | Fill #0

## 2019-09-01 MED FILL — LORazepam 2 MG TABS: 2 | 1 days supply | Qty: 1 | Fill #0

## 2019-09-01 MED FILL — ONDANSETRON ODT 4 MG TABLET: 4 | 2 days supply | Qty: 6 | Fill #0

## 2019-09-01 MED FILL — AMOXICILLIN 250 MG CAPSULE: 250 | 5 days supply | Qty: 15 | Fill #0

## 2019-09-23 ENCOUNTER — Ambulatory Visit: Payer: 59 | Admitting: Medical

## 2019-09-25 ENCOUNTER — Ambulatory Visit: Payer: 59 | Admitting: Medical

## 2019-10-01 ENCOUNTER — Encounter: Payer: Self-pay | Admitting: Medical

## 2019-10-01 ENCOUNTER — Other Ambulatory Visit: Payer: Self-pay

## 2019-10-01 ENCOUNTER — Ambulatory Visit: Payer: 59 | Admitting: Medical

## 2019-10-01 VITALS — BP 150/110 | HR 99 | Temp 98.0°F | Resp 18 | Ht 63.0 in | Wt 176.2 lb

## 2019-10-01 DIAGNOSIS — R7989 Other specified abnormal findings of blood chemistry: Secondary | ICD-10-CM

## 2019-10-01 DIAGNOSIS — I10 Essential (primary) hypertension: Secondary | ICD-10-CM | POA: Diagnosis not present

## 2019-10-01 MED ORDER — METOPROLOL SUCCINATE ER 50 MG PO TB24: 50.0000 mg | ORAL_TABLET | Freq: Every day | ORAL | 1 refills | Status: DC

## 2019-10-01 MED ORDER — AMLODIPINE BESYLATE 5 MG PO TABS: 5.0000 mg | ORAL_TABLET | Freq: Every day | ORAL | 3 refills | Status: DC

## 2019-10-01 MED FILL — METOPROLOL SUCCINATE ER 50: 50 | 90 days supply | Qty: 90 | Fill #0

## 2019-10-01 MED FILL — AMLODIPINE BESYLATE 5 MG TA: 5 | 90 days supply | Qty: 90 | Fill #0

## 2019-10-01 NOTE — Progress Notes (Signed)
Subjective:    Patient ID: Tracy Arnold, female    DOB: [birth year], 34 y.o.   MRN: [id number]  HPI Pt bp is usually 140-150 systolic and diastolic is 95-100. Pt is on hctz and toprol. No cardiac or neurologic signs or symptoms.  These readings are about 4 days a week.   lmp- past month. Pt engaged.   Review of Systems  Constitutional: Negative for chills and fever.       Some fatigue that she thinks related to work load.  Respiratory: Negative for cough, chest tightness, shortness of breath and wheezing.   Cardiovascular: Negative for chest pain and palpitations.  Gastrointestinal: Negative for abdominal pain.  Musculoskeletal: Negative for back pain.  Neurological: Negative for dizziness, syncope, speech difficulty, weakness and headaches.  Hematological: Negative for adenopathy. Does not bruise/bleed easily.  Psychiatric/Behavioral: Negative for behavioral problems and confusion.    Past Medical History:  Diagnosis Date  . Hypertension    borderline in past.     Social History   Socioeconomic History  . Marital status: Single    Spouse name: Not on file  . Number of children: Not on file  . Years of education: Not on file  . Highest education level: Not on file  Occupational History  . Not on file  Tobacco Use  . Smoking status: Never Smoker  . Smokeless tobacco: Never Used  Substance and Sexual Activity  . Alcohol use: Never  . Drug use: Never  . Sexual activity: Not on file  Other Topics Concern  . Not on file  Social History Narrative  . Not on file   Social Determinants of Health   Financial Resource Strain:   . Difficulty of Paying Living Expenses: Not on file  Food Insecurity:   . Worried About Programme researcher, broadcasting/film/video in the Last Year: Not on file  . Ran Out of Food in the Last Year: Not on file  Transportation Needs:   . Lack of Transportation (Medical): Not on file  . Lack of Transportation (Non-Medical): Not on file  Physical Activity:   . Days  of Exercise per Week: Not on file  . Minutes of Exercise per Session: Not on file  Stress:   . Feeling of Stress : Not on file  Social Connections:   . Frequency of Communication with Friends and Family: Not on file  . Frequency of Social Gatherings with Friends and Family: Not on file  . Attends Religious Services: Not on file  . Active Member of Clubs or Organizations: Not on file  . Attends Banker Meetings: Not on file  . Marital Status: Not on file  Intimate Partner Violence:   . Fear of Current or Ex-Partner: Not on file  . Emotionally Abused: Not on file  . Physically Abused: Not on file  . Sexually Abused: Not on file    No past surgical history on file.  Family History  Problem Relation Age of Onset  . Diabetes Father   . Hypertension Father     No Known Allergies  Current Outpatient Medications on File Prior to Visit  Medication Sig Dispense Refill  . hydrochlorothiazide (MICROZIDE) 12.5 MG capsule Take 1 capsule (12.5 mg total) by mouth daily. 30 capsule 3  . metoprolol succinate (TOPROL-XL) 50 MG 24 hr tablet Take 1 tablet (50 mg total) by mouth daily. Take with or immediately following a meal. 90 tablet 0   No current facility-administered medications on file prior to visit.  BP (!) 150/110 (BP Location: Left Arm, Patient Position: Sitting, Cuff Size: Normal)   Pulse 99   Temp 98 F (36.7 C) (Temporal)   Resp 18   Ht 5\' 3"  (1.6 m)   Wt 176 lb 3.2 oz (79.9 kg)   SpO2 99%   BMI 31.21 kg/m       Objective:   Physical Exam  General Mental Status- Alert. General Appearance- Not in acute distress.   Skin General: Color- Normal Color. Moisture- Normal Moisture.  Neck Carotid Arteries- Normal color. Moisture- Normal Moisture. No carotid bruits. No JVD.  Chest and Lung Exam Auscultation: Breath Sounds:-Normal.  Cardiovascular Auscultation:Rythm- Regular. Murmurs & Other Heart Sounds:Auscultation of the heart reveals- No Murmurs.   Abdomen Inspection:-Inspeection Normal. Palpation/Percussion:Note:No mass. Palpation and Percussion of the abdomen reveal- Non Tender, Non Distended + BS, no rebound or guarding.  Neurologic Cranial Nerve exam:- CN III-XII intact(No nystagmus), symmetric smile. Strength:- 5/5 equal and symmetric strength both upper and lower extremities.      Assessment & Plan:  Your bp is elevated recently despite tx. Will add amlodipine to current treatment regimen.  Continue healthy diet and exercise.  Follow up one month   20 minutes spent with pt. 50% of time spent counseling pt on plan going forward.   Mackie Pai, PA-C

## 2019-10-01 NOTE — Patient Instructions (Addendum)
Your bp is elevated recently despite tx. Will add amlodipine to current treatment regimen.  Continue healthy diet and exercise.  Follow up one month or as needed  Follow up for bp check and also recheck thyroid studies for elevated tsh

## 2019-10-03 DIAGNOSIS — H9203 Otalgia, bilateral: Secondary | ICD-10-CM | POA: Diagnosis not present

## 2019-10-03 DIAGNOSIS — I10 Essential (primary) hypertension: Secondary | ICD-10-CM | POA: Diagnosis not present

## 2019-10-03 DIAGNOSIS — Z20822 Contact with and (suspected) exposure to covid-19: Secondary | ICD-10-CM | POA: Diagnosis not present

## 2019-10-03 DIAGNOSIS — R05 Cough: Secondary | ICD-10-CM | POA: Diagnosis not present

## 2019-10-03 DIAGNOSIS — J029 Acute pharyngitis, unspecified: Secondary | ICD-10-CM | POA: Diagnosis not present

## 2019-10-13 ENCOUNTER — Telehealth: Payer: 59 | Admitting: Physician Assistant

## 2019-10-13 ENCOUNTER — Encounter (HOSPITAL_COMMUNITY): Payer: Self-pay

## 2019-10-13 ENCOUNTER — Other Ambulatory Visit: Payer: Self-pay

## 2019-10-13 ENCOUNTER — Ambulatory Visit (INDEPENDENT_AMBULATORY_CARE_PROVIDER_SITE_OTHER): Payer: 59

## 2019-10-13 ENCOUNTER — Ambulatory Visit (HOSPITAL_COMMUNITY)
Admission: EM | Admit: 2019-10-13 | Discharge: 2019-10-13 | Disposition: A | Discharge status: Home / Self Care | Payer: 59 | Attending: Family Medicine | Admitting: Family Medicine

## 2019-10-13 DIAGNOSIS — Z20822 Contact with and (suspected) exposure to covid-19: Secondary | ICD-10-CM | POA: Insufficient documentation

## 2019-10-13 DIAGNOSIS — R059 Cough, unspecified: Secondary | ICD-10-CM

## 2019-10-13 DIAGNOSIS — R05 Cough: Secondary | ICD-10-CM | POA: Diagnosis not present

## 2019-10-13 DIAGNOSIS — J209 Acute bronchitis, unspecified: Secondary | ICD-10-CM | POA: Insufficient documentation

## 2019-10-13 MED ORDER — AZITHROMYCIN 250 MG PO TABS: ORAL_TABLET | ORAL | 0 refills | Status: AC

## 2019-10-13 MED ORDER — FLUTICASONE PROPIONATE 50 MCG/ACT NA SUSP: 2.0000 | Freq: Every day | NASAL | 0 refills | Status: DC

## 2019-10-13 MED ORDER — PREDNISONE 10 MG (21) PO TBPK: ORAL_TABLET | Freq: Every day | ORAL | 0 refills | Status: DC

## 2019-10-13 MED ORDER — BENZONATATE 100 MG PO CAPS: 100.0000 mg | ORAL_CAPSULE | Freq: Three times a day (TID) | ORAL | 0 refills | Status: AC

## 2019-10-13 MED FILL — BENZONATATE 100 MG CAPS: 100 | 5 days supply | Qty: 15 | Fill #0

## 2019-10-13 MED FILL — FLUTICASONE PROP 50 MCG SPR: 50 | 30 days supply | Qty: 16 | Fill #0

## 2019-10-13 MED FILL — predniSONE 10 MG TABS: 10 | 6 days supply | Qty: 21 | Fill #0

## 2019-10-13 MED FILL — AZITHROMYCIN 250 MG TABLET: 250 | 5 days supply | Qty: 6 | Fill #0

## 2019-10-13 NOTE — ED Triage Notes (Signed)
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 States visited PMD on Jan 20 for standard eval. Started sore throat, cough on Jan 21 had Covid test (negative) and strep test negative on Jan 22.  Pt c/o persistent cough with recurrent sore throat since Jan 27.  Denies nasal congestion, body aches, fever, chills, abd pain, n/v/d, loss of taste/smell. Denies SOB but states she feels like she has "bronchitis".

## 2019-10-13 NOTE — ED Provider Notes (Signed)
[facility]    CSN: [id number] Arrival date & time: 10/13/19  1237      History   Chief Complaint Chief Complaint  Patient presents with  . Cough    HPI Tracy Arnold is a 34 y.o. female.   Tracy Arnold presents with complaints of persistent cough as well as sore throat. Symptoms started night of 1/21.  That night noted sore throat. The next night noted worsening of sore throat. Sore throat worsened, developed a cough and some runny nose. Went to another urgent care and had rapid covid and strep testing which were negative.  Started to feel better for a few days. However, over the past 3-4 days cough has began to worsen again, worse in the evening. Hydration helps. 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 Sore throat is also back, although not as severe as it was originally. Worse to the left side of throat. Recently started on hctz, she attributed her throat to maybe being more dry due to this. No fevers. Has gotten her covid vaccine series with dose 2 on January 13th. No headache, no body aches. No shortness of breath . Only occasional nasal drainage, worse after coughing episodes, however. No pain with breathing, no difficulty with breathing. Her heart rate has been in the high 90's to 100's at baseline. Has had some similar illness in the past with bronchitis, last in approximately 2018. Feels similar to bronchitis she had before. Lower extremity swelling has improved with taking hctz. She did tried to do an electronic visit through [employer], was given tessalon but hasn't taken yet.     ROS per HPI, negative if not otherwise mentioned.      Past Medical History:  Diagnosis Date  . Hypertension    borderline in past.    There are no problems to display for this patient.   History reviewed. No pertinent surgical history.  OB History   No obstetric history on file.      Home Medications    Prior to Admission medications   Medication Sig Start Date  End Date Taking? Authorizing Provider  amLODipine (NORVASC) 5 MG tablet Take 1 tablet (5 mg total) by mouth daily. 10/01/19  Yes Saguier, Percell Miller, PA-C  hydrochlorothiazide (MICROZIDE) 12.5 MG capsule Take 1 capsule (12.5 mg total) by mouth daily. 08/25/19  Yes Saguier, Percell Miller, PA-C  metoprolol succinate (TOPROL-XL) 50 MG 24 hr tablet Take 1 tablet (50 mg total) by mouth daily. Take with or immediately following a meal. 10/01/19  Yes Saguier, Percell Miller, PA-C  azithromycin (ZITHROMAX) 250 MG tablet Take 2 tablets (500 mg total) by mouth daily for 1 day, THEN 1 tablet (250 mg total) daily for 4 days. 10/13/19 10/18/19  Zigmund Gottron, NP  benzonatate (TESSALON) 100 MG capsule Take 1 capsule (100 mg total) by mouth every 8 (eight) hours for 5 days. 10/13/19 10/18/19  Couture, Cortni S, PA-C  fluticasone (FLONASE) 50 MCG/ACT nasal spray Place 2 sprays into both nostrils daily. 10/13/19   Couture, Cortni S, PA-C  predniSONE (STERAPRED UNI-PAK 21 TAB) 10 MG (21) TBPK tablet Take by mouth daily. Per box instruction 10/13/19   Zigmund Gottron, NP    Family History Family History  Problem Relation Age of Onset  . Diabetes Father   . Hypertension Father     Social History Social History   Tobacco Use  . Smoking status: Never Smoker  . Smokeless tobacco: Never Used  Substance Use Topics  .  Alcohol use: Never  . Drug use: Never     Allergies   Patient has no known allergies.   Review of Systems Review of Systems   Physical Exam Triage Vital Signs ED Triage Vitals  Enc Vitals Group     BP 10/13/19 1306 (!) 146/95     Pulse Rate 10/13/19 1306 (!) 115     Resp 10/13/19 1306 18     Temp 10/13/19 1306 97.7 F (36.5 C)     Temp Source 10/13/19 1306 Oral     SpO2 10/13/19 1306 100 %     Weight --      Height --      Head Circumference --      Peak Flow --      Pain Score 10/13/19 1302 3     Pain Loc --      Pain Edu? --      Excl. in GC? --    No data found.  Updated Vital Signs BP (!)  146/95 (BP Location: Left Arm)   Pulse 99   Temp 97.7 F (36.5 C) (Oral)   Resp 18   LMP 09/22/2019   SpO2 100%    Physical Exam Constitutional:      General: She is not in acute distress.    Appearance: She is well-developed.  HENT:     Mouth/Throat:     Mouth: Mucous membranes are moist.     Pharynx: No oropharyngeal exudate or uvula swelling.     Tonsils: No tonsillar exudate or tonsillar abscesses. 1+ on the right. 1+ on the left.  Cardiovascular:     Rate and Rhythm: Normal rate.  Pulmonary:     Effort: Pulmonary effort is normal.     Breath sounds: Normal breath sounds.     Comments: Intermittent strong dry cough noted  Skin:    General: Skin is warm and dry.  Neurological:     Mental Status: She is alert and oriented to person, place, and time.      UC Treatments / Results  Labs (all labs ordered are listed, but only abnormal results are displayed) Labs Reviewed  NOVEL CORONAVIRUS, NAA (HOSP ORDER, SEND-OUT TO REF LAB; TAT 18-24 HRS)    EKG   Radiology DG Chest 2 View  Result Date: 10/13/2019 CLINICAL DATA:  Persistent dry cough EXAM: CHEST - 2 VIEW COMPARISON:  November 01, 2017 FINDINGS: The heart size and mediastinal contours are within normal limits. Both lungs are clear. The visualized skeletal structures are unremarkable. IMPRESSION: No active cardiopulmonary disease. Electronically Signed   By: Sherian Rein M.D.   On: 10/13/2019 13:54    Procedures Procedures (including critical care time)  Medications Ordered in UC Medications - No data to display  Initial Impression / Assessment and Plan / UC Course  I have reviewed the triage vital signs and the nursing notes.  Pertinent labs & imaging results that were available during my care of the patient were reviewed by me and considered in my medical decision making (see chart for details).     Non toxic. Benign physical exam.  Cough persists for great than 10 days, with recent worsening after it  had improved. Negative rapid covid and had been vaccinated prior to onset of symptoms. No chest pain , no shortness of breath , no leg swelling. Mild sore throat and nasal congestion. covid pcr collected to confirm negative. Bronchitis treatment provided. Return precautions provided. If symptoms worsen or do not improve in the  next week to return to be seen or to follow up with PCP.  Patient verbalized understanding and agreeable to plan.   Final Clinical Impressions(s) / UC Diagnoses   Final diagnoses:  Acute bronchitis, unspecified organism     Discharge Instructions     Your covid PCR testing is pending. It should result in the next 2-3 days.  Will notify of any positive findings and negative's are through your mychart.  Your xray is normal today which is reassuring.  Due to the persistence of your symptoms and somewhat resurgence of symptoms we will try a course of steroids and antibiotics.  If worsening of symptoms, shortness of breath , fever, swelling, or no improvement in the next 7-10 days don't hesitate to return or see your PCP.     ED Prescriptions    Medication Sig Dispense Auth. Provider   predniSONE (STERAPRED UNI-PAK 21 TAB) 10 MG (21) TBPK tablet Take by mouth daily. Per box instruction 21 tablet ,  B, NP   azithromycin (ZITHROMAX) 250 MG tablet Take 2 tablets (500 mg total) by mouth daily for 1 day, THEN 1 tablet (250 mg total) daily for 4 days. 6 tablet Georgetta Haber, NP     PDMP not reviewed this encounter.   Georgetta Haber, NP 10/13/19 1635

## 2019-10-13 NOTE — Discharge Instructions (Addendum)
Your covid PCR testing is pending. It should result in the next 2-3 days.  Will notify of any positive findings and negative's are through your mychart.  Your xray is normal today which is reassuring.  Due to the persistence of your symptoms and somewhat resurgence of symptoms we will try a course of steroids and antibiotics.  If worsening of symptoms, shortness of breath , fever, swelling, or no improvement in the next 7-10 days don't hesitate to return or see your PCP.

## 2019-10-13 NOTE — Progress Notes (Signed)
E-Visit for Corona Virus Screening  Your current symptoms could be consistent with the coronavirus.  Many health care providers can now test patients at their office but not all are.  [facility] has multiple testing sites. For information on our [place] testing locations and hours go to [email or web address]  We are enrolling you in our [facility] for Pax . Daily you will receive a questionnaire within the [place] website. Our COVID 19 response team will be monitoring your responses daily.  Testing Information: The COVID-19 Community Testing sites will begin testing BY APPOINTMENT ONLY.  You can schedule online at [email or web address]  If you do not have access to a smart phone or computer you may call [phone] for an appointment.   Additional testing sites in the Community:  . For [facility] Testing sites in [facility]  FaceUpdate.uy  . For Pop-up testing sites in [place]  BowlDirectory.co.uk  . For Testing sites with regular hours [email or web address].[email or web address]  . For [place] [email or web address]  . For [facility] [email or web address]  . For [facility] testing in [place] and [employer] [email or web address]  . For [facility] testing in [facility]   [email or web address].[email or web address]  For  more information about community testing call [phone]   Please quarantine yourself while awaiting your test results. Please stay home for a minimum of 10 days from the first day of illness with improving symptoms and you have had 24 hours of no fever (without the use of Tylenol (Acetaminophen)  Motrin (Ibuprofen) or any fever reducing medication).  Also - Do not get tested prior to returning to work because once you have had a positive test the test can stay positive for more then a month in some cases.   You should wear a mask or cloth face covering over your nose and mouth if you must be around other people or animals, including pets (even at home). Try to stay at least 6 feet away from other people. This will protect the people around you.  Please continue good preventive care measures, including:  frequent hand-washing, avoid touching your face, cover coughs/sneezes, stay out of crowds and keep a 6 foot distance from others.  COVID-19 is a respiratory illness with symptoms that are similar to the flu. Symptoms are typically mild to moderate, but there have been cases of severe illness and death due to the virus.   The following symptoms may appear 2-14 days after exposure: . Fever . Cough . Shortness of breath or difficulty breathing . Chills . Repeated shaking with chills . Muscle pain . Headache . Sore throat . New loss of taste or smell . Fatigue . Congestion or runny nose . Nausea or vomiting . Diarrhea  Go to the nearest hospital ED for assessment if fever/cough/breathlessness are severe or illness seems like a threat to life.  It is vitally important that if you feel that you have an infection such as this virus or any other virus that you stay home and away from places where you may spread it to others.  You should avoid contact with people age 69 and older.   You can use medication such as A prescription cough medication called Tessalon Perles 100 mg. You may take 1-2 capsules every 8 hours as needed for cough I have also called in Fluticasone nasal spray for your nasal congestion. Use two sprays in each nostril every morning for 7 days.  You may also take acetaminophen (Tylenol) as needed for fever.  Reduce  your risk of any infection by using the same precautions used  for avoiding the common cold or flu:  Marland Kitchen Wash your hands often with soap and warm water for at least 20 seconds.  If soap and water are not readily available, use an alcohol-based hand sanitizer with at least 60% alcohol.  . If coughing or sneezing, cover your mouth and nose by coughing or sneezing into the elbow areas of your shirt or coat, into a tissue or into your sleeve (not your hands). . Avoid shaking hands with others and consider head nods or verbal greetings only. . Avoid touching your eyes, nose, or mouth with unwashed hands.  . Avoid close contact with people who are sick. . Avoid places or events with large numbers of people in one location, like concerts or sporting events. . Carefully consider travel plans you have or are making. . If you are planning any travel outside or inside the [place], visit the CDC's Travelers' Health webpage for the latest health notices. . If you have some symptoms but not all symptoms, continue to monitor at home and seek medical attention if your symptoms worsen. . If you are having a medical emergency, call 911.  HOME CARE . Only take medications as instructed by your medical team. . Drink plenty of fluids and get plenty of rest. . A steam or ultrasonic humidifier can help if you have congestion.   GET HELP RIGHT AWAY IF YOU HAVE EMERGENCY WARNING SIGNS** FOR COVID-19. If you or someone is showing any of these signs seek emergency medical care immediately. Call 911 or proceed to your closest emergency facility if: . You develop worsening high fever. . Trouble breathing . Bluish lips or face . Persistent pain or pressure in the chest . New confusion . Inability to wake or stay awake . You cough up blood. . Your symptoms become more severe  **This list is not all possible symptoms. Contact your medical provider for any symptoms that are sever or concerning to you.  MAKE SURE YOU   Understand these instructions.  Will watch your condition.  Will  get help right away if you are not doing well or get worse.  Your e-visit answers were reviewed by a board certified advanced clinical practitioner to complete your personal care plan.  Depending on the condition, your plan could have included both over the counter or prescription medications.  If there is a problem please reply once you have received a response from your provider.  Your safety is important to [place].  If you have drug allergies check your prescription carefully.    You can use MyChart to ask questions about today's visit, request a non-urgent call back, or ask for a work or school excuse for 24 hours related to this e-Visit. If it has been greater than 24 hours you will need to follow up with your provider, or enter a new e-Visit to address those concerns. You will get an e-mail in the next two days asking about your experience.  I hope that your e-visit has been valuable and will speed your recovery. Thank you for using e-visits.  Approximately 5 minutes was spent documenting and reviewing patient's chart.

## 2019-10-15 LAB — NOVEL CORONAVIRUS, NAA (HOSP ORDER, SEND-OUT TO REF LAB; TAT 18-24 HRS): SARS-CoV-2, NAA: NOT DETECTED

## 2019-10-31 ENCOUNTER — Encounter: Payer: Self-pay | Admitting: Medical

## 2019-11-04 ENCOUNTER — Other Ambulatory Visit: Payer: Self-pay

## 2019-11-04 ENCOUNTER — Ambulatory Visit (INDEPENDENT_AMBULATORY_CARE_PROVIDER_SITE_OTHER): Payer: 59 | Admitting: Medical

## 2019-11-04 DIAGNOSIS — J309 Allergic rhinitis, unspecified: Secondary | ICD-10-CM

## 2019-11-04 DIAGNOSIS — R05 Cough: Secondary | ICD-10-CM | POA: Diagnosis not present

## 2019-11-04 DIAGNOSIS — K219 Gastro-esophageal reflux disease without esophagitis: Secondary | ICD-10-CM | POA: Diagnosis not present

## 2019-11-04 DIAGNOSIS — R059 Cough, unspecified: Secondary | ICD-10-CM

## 2019-11-04 MED ORDER — FAMOTIDINE 20 MG PO TABS: 20.0000 mg | ORAL_TABLET | Freq: Every day | ORAL | 0 refills | Status: AC

## 2019-11-04 MED ORDER — HYDROCOD POLST-CPM POLST ER 10-8 MG/5ML PO SUER: 5.0000 mL | Freq: Every evening | ORAL | 0 refills | Status: DC | PRN

## 2019-11-04 MED ORDER — MONTELUKAST SODIUM 10 MG PO TABS: 10.0000 mg | ORAL_TABLET | Freq: Every day | ORAL | 3 refills | Status: DC

## 2019-11-04 MED ORDER — LEVOCETIRIZINE DIHYDROCHLORIDE 5 MG PO TABS: 5.0000 mg | ORAL_TABLET | Freq: Every evening | ORAL | 3 refills | Status: DC

## 2019-11-04 MED FILL — FAMOTIDINE 20 MG TABS: 20 | 30 days supply | Qty: 30 | Fill #0

## 2019-11-04 MED FILL — HYDROCODONE-CHLORPHEN ER SU: 10-8 | 14 days supply | Qty: 70 | Fill #0

## 2019-11-04 MED FILL — MONTELUKAST SOD 10 MG TAB: 10 | 30 days supply | Qty: 30 | Fill #0

## 2019-11-04 MED FILL — LEVOCETIRIZINE 5 MG TABLET: 5 | 30 days supply | Qty: 30 | Fill #0

## 2019-11-04 NOTE — Progress Notes (Signed)
   Subjective:    Patient ID: Tracy Arnold, female    DOB: [birth year], 34 y.o.   MRN: [id number]  HPI  Virtual Visit via Telephone Note  I connected with Tracy Arnold on 11/04/19 at 10:00 AM EST by telephone and verified that I am speaking with the correct person using two identifiers.  Location: Patient: car Provider: office   I discussed the limitations, risks, security and privacy concerns of performing an evaluation and management service by telephone and the availability of in person appointments. I also discussed with the patient that there may be a patient responsible charge related to this service. The patient expressed understanding and agreed to proceed.   History of Present Illness:  Pt states cough started jan 22nd of January. She had st and cough. [facility]  and had negative strep and covid toast. For one week got better then cough came back. Tried dayquil and nyquil. Cough was worse at night. Then saw UC with [facility] and had negative covid and negative cxr. Pt told UC get cough typically in feb. She got prednisone, zpack, flonase, and benzonatate. Pt never took prednisone pack. She states took all meds except never took prednisone.  Cough is worse at night. Cough at times seems worse after eating. Example at some late at night. And had back cough last night. Does have hx of reflux in the past.    Observations/Objective: General- no acute distress, pleasant, alert and oriented.    Assessment and Plan: I do think recent cough is likely combination of allergies and potentially reflux. I want you to start xyzal, flonase and montelukast. Also make tussionex available at night for cough.   Also rx famotadine for potential reflux.   Follow up in 10 days or as needed  Follow Up Instructions:    I discussed the assessment and treatment plan with the patient. The patient was provided an opportunity to ask questions and all were answered. The patient agreed with  the plan and demonstrated an understanding of the instructions.   The patient was advised to call back or seek an in-person evaluation if the symptoms worsen or if the condition fails to improve as anticipated.  I provided 30 minutes of non-face-to-face time during this encounter.   Esperanza Richters, PA-C   Review of Systems     Objective:   Physical Exam        Assessment & Plan:

## 2019-11-04 NOTE — Patient Instructions (Addendum)
I do think recent cough is likely combination of allergies and potentially reflux. I want you to start xyzal, flonase and montelukast. Also make tussionex available at night for cough.   Also rx famotadine for potential reflux.   Follow up in 10 days or as needed

## 2019-11-07 ENCOUNTER — Ambulatory Visit: Payer: 59 | Admitting: Medical

## 2019-12-02 DIAGNOSIS — M25552 Pain in left hip: Secondary | ICD-10-CM | POA: Diagnosis not present

## 2019-12-02 MED FILL — HYDROCHLOROTHIAZIDE 12.5 MG: 12.5 | 30 days supply | Qty: 30 | Fill #1

## 2019-12-04 MED FILL — MONTELUKAST SOD 10 MG TAB: 10 | 30 days supply | Qty: 30 | Fill #1

## 2019-12-04 MED FILL — LEVOCETIRIZINE 5 MG TABLET: 5 | 30 days supply | Qty: 30 | Fill #1

## 2019-12-17 DIAGNOSIS — M25552 Pain in left hip: Secondary | ICD-10-CM | POA: Diagnosis not present

## 2019-12-30 ENCOUNTER — Other Ambulatory Visit: Payer: Self-pay | Admitting: Medical

## 2019-12-30 DIAGNOSIS — M25552 Pain in left hip: Secondary | ICD-10-CM | POA: Diagnosis not present

## 2019-12-30 MED FILL — METOPROLOL SUCCINATE ER 50: 50 | 90 days supply | Qty: 90 | Fill #1

## 2019-12-30 MED FILL — HYDROCHLOROTHIAZIDE 12.5 MG: 12.5 | 90 days supply | Qty: 90 | Fill #0

## 2019-12-30 MED FILL — AMLODIPINE BESYLATE 5 MG TA: 5 | 30 days supply | Qty: 30 | Fill #1

## 2020-01-01 MED FILL — MELOXICAM 15 MG TABLET: 15 | 30 days supply | Qty: 30 | Fill #0

## 2020-01-02 ENCOUNTER — Encounter: Payer: Self-pay | Admitting: Medical

## 2020-01-08 DIAGNOSIS — M25552 Pain in left hip: Secondary | ICD-10-CM | POA: Diagnosis not present

## 2020-01-12 ENCOUNTER — Telehealth: Payer: Self-pay | Admitting: Medical

## 2020-01-12 ENCOUNTER — Other Ambulatory Visit: Payer: Self-pay

## 2020-01-12 ENCOUNTER — Other Ambulatory Visit (INDEPENDENT_AMBULATORY_CARE_PROVIDER_SITE_OTHER): Payer: 59

## 2020-01-12 DIAGNOSIS — R7989 Other specified abnormal findings of blood chemistry: Secondary | ICD-10-CM | POA: Diagnosis not present

## 2020-01-12 LAB — T4, FREE: Free T4: 0.7 ng/dL (ref 0.60–1.60)

## 2020-01-12 LAB — TSH: TSH: 19.02 u[IU]/mL — ABNORMAL HIGH (ref 0.35–4.50)

## 2020-01-12 MED ORDER — LEVOTHYROXINE SODIUM 25 MCG PO TABS: 25.0000 ug | ORAL_TABLET | Freq: Every day | ORAL | 1 refills | Status: DC

## 2020-01-12 NOTE — Telephone Encounter (Signed)
Levothyroxine sent to pt pharmacy.

## 2020-01-13 ENCOUNTER — Telehealth: Payer: Self-pay | Admitting: Medical

## 2020-01-13 ENCOUNTER — Encounter: Payer: Self-pay | Admitting: Medical

## 2020-01-13 ENCOUNTER — Telehealth (INDEPENDENT_AMBULATORY_CARE_PROVIDER_SITE_OTHER): Payer: 59 | Admitting: Medical

## 2020-01-13 VITALS — BP 120/90 | Ht 63.0 in | Wt 181.0 lb

## 2020-01-13 DIAGNOSIS — K219 Gastro-esophageal reflux disease without esophagitis: Secondary | ICD-10-CM

## 2020-01-13 DIAGNOSIS — R5383 Other fatigue: Secondary | ICD-10-CM

## 2020-01-13 DIAGNOSIS — I10 Essential (primary) hypertension: Secondary | ICD-10-CM

## 2020-01-13 DIAGNOSIS — E039 Hypothyroidism, unspecified: Secondary | ICD-10-CM

## 2020-01-13 DIAGNOSIS — R7989 Other specified abnormal findings of blood chemistry: Secondary | ICD-10-CM | POA: Diagnosis not present

## 2020-01-13 DIAGNOSIS — E669 Obesity, unspecified: Secondary | ICD-10-CM

## 2020-01-13 DIAGNOSIS — J309 Allergic rhinitis, unspecified: Secondary | ICD-10-CM

## 2020-01-13 LAB — THYROID PEROXIDASE ANTIBODY: Thyroperoxidase Ab SerPl-aCnc: >900 IU/mL — ABNORMAL HIGH (ref <9)

## 2020-01-13 MED ORDER — FAMOTIDINE 20 MG PO TABS: 20.0000 mg | ORAL_TABLET | Freq: Every day | ORAL | 3 refills | Status: DC

## 2020-01-13 MED FILL — FAMOTIDINE 20 MG TABS: 20 | 90 days supply | Qty: 90 | Fill #0

## 2020-01-13 MED FILL — LEVOTHYROXINE SODIUM 25 MCG: 25 | 30 days supply | Qty: 30 | Fill #0

## 2020-01-13 MED FILL — tiZANidine HCL 4 MG TABS: 4 | 30 days supply | Qty: 60 | Fill #0

## 2020-01-13 NOTE — Patient Instructions (Addendum)
For hypothyroid sent in low dose levothyroxine 25 mcg and repeat labs in 6 weeks.  Will refill famotadine since reflux worse with nsaids.  For hip pain follow ortho management.  For obesity/bmi so will refer to weight management clinic.  Htn better but still mild high diastolic.  Glad to hear your allergies are better.  Follow up 6-7 weeks.

## 2020-01-13 NOTE — Telephone Encounter (Signed)
Future tsh and t4 placed. Please get her scheduled for lab in 6 weeks.

## 2020-01-13 NOTE — Progress Notes (Signed)
   Subjective:    Patient ID: Tracy Arnold, female    DOB: 18-Nov-1985, 34 y.o.   MRN: [id number]  HPI  Virtual Visit via Video Note  I connected with Tracy Arnold on 01/13/20 at 11:00 AM EDT by a video enabled telemedicine application and verified that I am speaking with the correct person using two identifiers.  Location: Patient: work Provider: office   I discussed the limitations of evaluation and management by telemedicine and the availability of in person appointments. The patient expressed understanding and agreed to proceed.  History of Present Illness:  Pt had tsh and t4 done recently. Tsh was elevated and t4 has decreased. Pt was reporting recent fatigue and was still having trouble loosing weight.   Pt states pain in left hip still effecting her ability to exercise freely. Her mri showed worse finding. Pt has been trying to hold off on surgery. She has still been exercising. Has been doing limited exercises that PT showed her. Pt got steroid injection but still has pain. She is using meloxicam and still has pain. Ice seems to help the most. She tore labrum.   Pt states stress is less now.   We discussed possible referral to weight loss clinic. 3 day ago her weight was 181  Pt is planning to get married in 3 months. So she wants to do surgery yet.  Recent reflux worse at time with nsaid use.  Prior allergies responded well to xyzal and singulair  bp well controlled.   Observations/Objective: General-no acute distress, pleasant, oriented. Lungs- on inspection lungs appear unlabored. Neck- no tracheal deviation or jvd on inspection. Neuro- gross motor function appears intact.  Assessment and Plan: For hypothyroid sent in low dose levothyroxine 25 mcg and repeat labs in 6 weeks.  Will refill famotadine since reflux worse with nsaids.  For hip pain follow ortho management.  For obesity/bmi so will refer to weight management clinic.  Htn better but still mild high  diastolic.  Glad to hear your allergies are better.  Follow up 6-7 weeks.   Time spent with patient today was 20  minutes which consisted of chart review, discussing diagnosis, work up treatment and documentation.  Follow Up Instructions:    I discussed the assessment and treatment plan with the patient. The patient was provided an opportunity to ask questions and all were answered. The patient agreed with the plan and demonstrated an understanding of the instructions.   The patient was advised to call back or seek an in-person evaluation if the symptoms worsen or if the condition fails to improve as anticipated.     Esperanza Richters, PA-C    Review of Systems     Objective:   Physical Exam        Assessment & Plan:

## 2020-01-13 NOTE — Telephone Encounter (Signed)
Called and lvm to return .

## 2020-01-16 NOTE — Telephone Encounter (Signed)
Called pt again and lvm to return call 

## 2020-01-21 DIAGNOSIS — S76012D Strain of muscle, fascia and tendon of left hip, subsequent encounter: Secondary | ICD-10-CM | POA: Diagnosis not present

## 2020-01-21 DIAGNOSIS — M25552 Pain in left hip: Secondary | ICD-10-CM | POA: Diagnosis not present

## 2020-01-21 DIAGNOSIS — M6281 Muscle weakness (generalized): Secondary | ICD-10-CM | POA: Diagnosis not present

## 2020-02-02 ENCOUNTER — Other Ambulatory Visit: Payer: Self-pay | Admitting: Medical

## 2020-02-02 MED FILL — AMLODIPINE BESYLATE 5 MG TA: 5 | 90 days supply | Qty: 90 | Fill #0

## 2020-02-03 ENCOUNTER — Encounter (INDEPENDENT_AMBULATORY_CARE_PROVIDER_SITE_OTHER): Payer: Self-pay | Admitting: Family Medicine

## 2020-02-03 ENCOUNTER — Ambulatory Visit (INDEPENDENT_AMBULATORY_CARE_PROVIDER_SITE_OTHER): Payer: 59 | Admitting: Family Medicine

## 2020-02-03 ENCOUNTER — Other Ambulatory Visit: Payer: Self-pay

## 2020-02-03 VITALS — BP 123/85 | HR 83 | Temp 97.9°F | Ht 63.0 in | Wt 174.0 lb

## 2020-02-03 DIAGNOSIS — Z1331 Encounter for screening for depression: Secondary | ICD-10-CM | POA: Diagnosis not present

## 2020-02-03 DIAGNOSIS — R5383 Other fatigue: Secondary | ICD-10-CM

## 2020-02-03 DIAGNOSIS — E669 Obesity, unspecified: Secondary | ICD-10-CM | POA: Diagnosis not present

## 2020-02-03 DIAGNOSIS — R0683 Snoring: Secondary | ICD-10-CM | POA: Diagnosis not present

## 2020-02-03 DIAGNOSIS — R0602 Shortness of breath: Secondary | ICD-10-CM

## 2020-02-03 DIAGNOSIS — Z0289 Encounter for other administrative examinations: Secondary | ICD-10-CM

## 2020-02-03 DIAGNOSIS — Z9189 Other specified personal risk factors, not elsewhere classified: Secondary | ICD-10-CM | POA: Diagnosis not present

## 2020-02-03 DIAGNOSIS — Z6831 Body mass index (BMI) 31.0-31.9, adult: Secondary | ICD-10-CM | POA: Diagnosis not present

## 2020-02-03 DIAGNOSIS — E039 Hypothyroidism, unspecified: Secondary | ICD-10-CM

## 2020-02-03 DIAGNOSIS — S73192A Other sprain of left hip, initial encounter: Secondary | ICD-10-CM

## 2020-02-03 DIAGNOSIS — I10 Essential (primary) hypertension: Secondary | ICD-10-CM | POA: Diagnosis not present

## 2020-02-03 NOTE — Progress Notes (Signed)
Office: [phone]  /  Fax: [phone]    Date: February 10, 2020   Appointment Start Time: 11:59am Duration: 48 minutes Provider: Glennie Isle, Psy.D. Type of Session: Intake for Individual Therapy  Location of Patient: Home Location of Provider: Provider's Home Type of Contact: Telepsychological Visit via MyChart Video Visit  Informed Consent: Prior to proceeding with today's appointment, two pieces of identifying information were obtained. In addition, Neeya's physical location at the time of this appointment was obtained as well a phone number she could be reached at in the event of technical difficulties. Alazae and this provider participated in today's telepsychological service.   The provider's role was explained to [employer]. The provider reviewed and discussed issues of confidentiality, privacy, and limits therein (e.g., reporting obligations). In addition to verbal informed consent, written informed consent for psychological services was obtained prior to the initial appointment. Since the clinic is not a 24/7 crisis center, mental health emergency resources were shared and this  provider explained MyChart, e-mail, voicemail, and/or other messaging systems should be utilized only for non-emergency reasons. This provider also explained that information obtained during appointments will be placed in Sheilah's medical record and relevant information will be shared with other providers at [facility] for coordination of care. Moreover, Cady agreed information may be shared with other [facility] providers as needed for coordination of care. By signing the service agreement document, Miliani provided written consent for coordination of care. Prior to initiating telepsychological services, Kashmere completed an informed consent document, which included the development of a safety plan (i.e., an emergency contact, nearest emergency room, and emergency resources) in the event of  an emergency/crisis. Brayleigh expressed understanding of the rationale of the safety plan. Hazelle verbally acknowledged understanding she is ultimately responsible for understanding her insurance benefits for telepsychological and in-person services. This provider also reviewed confidentiality, as it relates to telepsychological services, as well as the rationale for telepsychological services (i.e., to reduce exposure risk to COVID-19). Eleasha  acknowledged understanding that appointments cannot be recorded without both party consent and she is aware she is responsible for securing confidentiality on her end of the session. Mayme verbally consented to proceed.  Chief Complaint/HPI: Ryla was referred by Dr. Briscoe Deutscher on Feb 03, 2020. The note for the initial appointment with Dr. Briscoe Deutscher on Feb 03, 2020 indicated the following: "Brytni's habits were reviewed today and are as follows: Her family eats meals together, she thinks her family will eat healthier with her, her desired weight loss is 25+ pounds, she started gaining weight within the last 3-4 years, her heaviest weight ever was 180 pounds, she craves [place] food and sweets, she skips lunch sometimes, she follows a vegetarian diet, she is frequently drinking liquids with calories and she frequently makes poor food choices." Jonasia's Food and Mood (modified PHQ-9) score on Feb 03, 2020 was 8.  During today's appointment, Lelaina reported she started the prescribed meal plan, noting challenges during the holiday weekend. She was verbally administered a questionnaire assessing various behaviors related to emotional eating. Captola endorsed the following: overeat when you are celebrating, find food is comforting to you and not worry about what you eat when you are in a good mood. Jamie believes the onset of emotional eating was likely in recent years, adding she may skip meals due to work or unpleasant emotions. In addition, Lometa denied a history of binge eating. Kaithlyn  denied a history of restricting food intake, purging and  engagement in other compensatory strategies, and has never been diagnosed with an eating disorder. She also denied a history of treatment for emotional eating. Furthermore, Sukaina reported an increase in stress due to work.  Mental Status Examination:  Appearance: well groomed and appropriate hygiene  Behavior: appropriate to circumstances Mood: euthymic Affect: mood congruent Speech: normal in rate, volume, and tone Eye Contact: appropriate Psychomotor Activity: appropriate Gait: unable to assess Thought Process: linear, logical, and goal directed  Thought Content/Perception: denies suicidal and homicidal ideation, plan, and intent and no hallucinations, delusions, bizarre thinking or behavior reported or observed Orientation: time, person, place and purpose of appointment Memory/Concentration: memory, attention, language, and fund of knowledge intact  Insight/Judgment: good  Family & Psychosocial History: Len reported she is engaged. 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 Additionally, Darielle shared her highest level of education obtained is a BSN degree. Currently, Meriem's social support system consists of her parents, fiance, and two colleagues/best friends. Moreover, Maxcine stated she resides with her parents.   Medical History:  Past Medical History:  Diagnosis Date  . Edema, lower extremity   . GERD (gastroesophageal reflux disease)   . Hypertension    borderline in past.  . Hypothyroid   . Labral tear of left hip joint    No past surgical history on file. Current Outpatient Medications on File Prior to Visit  Medication Sig Dispense Refill  . amLODipine (NORVASC) 5 MG tablet TAKE 1 TABLET (5 MG TOTAL) BY MOUTH DAILY. 30 tablet 3  . famotidine (PEPCID) 20 MG tablet Take 1 tablet (20 mg total) by mouth daily. 30 tablet 0  . famotidine (PEPCID) 20 MG tablet Take 1 tablet (20 mg  total) by mouth daily. 90 tablet 3  . hydrochlorothiazide (MICROZIDE) 12.5 MG capsule TAKE 1 CAPSULE (12.5 MG TOTAL) BY MOUTH DAILY. 30 capsule 3  . levothyroxine (SYNTHROID) 25 MCG tablet Take 1 tablet (25 mcg total) by mouth daily before breakfast. 30 tablet 1  . meloxicam (MOBIC) 15 MG tablet Take 15 mg by mouth daily.    . metoprolol succinate (TOPROL-XL) 50 MG 24 hr tablet Take 1 tablet (50 mg total) by mouth daily. Take with or immediately following a meal. 90 tablet 1  . tiZANidine (ZANAFLEX) 4 MG tablet Take 4 mg by mouth 2 (two) times daily as needed.     No current facility-administered medications on file prior to visit.   Mental Health History: Reah reported attending one 20 minute online session in the midst of the pandemic. Tora reported there is no history of hospitalizations for psychiatric concerns, and she has never met with a psychiatrist. Minnette denied a family history of mental health related concerns. Soraiya reported there is no history of trauma including psychological, physical  and sexual abuse, as well as neglect.  Kambryn described her typical mood lately as "pretty good." Aside from concerns noted above and endorsed on the PHQ-9 and GAD-7, Hadja reported experiencing occasional crying spells at work and one panic attack at work. Olivia denied current alcohol use. She denied tobacco use. She denied illicit/recreational substance use. Regarding caffeine intake, Dazia reported consuming one cup of tea in the morning. Furthermore, Karaline indicated she is not experiencing the following: hallucinations and delusions, paranoia, symptoms of mania , social withdrawal and decreased motivation. She also denied history of and current suicidal ideation, plan, and intent; history of and current homicidal ideation, plan, and intent; and history of and current engagement in  self-harm.  The following strengths were reported by Chania: loyal, fierce, and dependable. The following strengths were observed by  this provider: ability to express thoughts and feelings during the therapeutic session, ability to establish and benefit from a therapeutic relationship, willingness to work toward established goal(s) with the clinic and ability to engage in reciprocal conversation.  Legal History: Lexianna reported there is no history of legal involvement.   Structured Assessments Results: The Patient Health Questionnaire-9 (PHQ-9) is a self-report measure that assesses symptoms and severity of depression over the course of the last two weeks. Twinkle obtained a score of 2 suggesting minimal depression. Loren finds the endorsed symptoms to be somewhat difficult. [0= Not at all; 1= Several days; 2= More than half the days; 3= Nearly every day] Little interest or pleasure in doing things 0  Feeling down, depressed, or hopeless 0  Trouble falling or staying asleep, or sleeping too much 1  Feeling tired or having little energy 1  Poor appetite or overeating 0  Feeling bad about yourself --- or that you are a failure or have let yourself or your family down 0  Trouble concentrating on things, such as reading the newspaper or watching television 0  Moving or speaking so slowly that other people could have noticed? Or the opposite --- being so fidgety or restless that you have been moving around a lot more than usual 0  Thoughts that you would be better off dead or hurting yourself in some way 0  PHQ-9 Score 2    The Generalized Anxiety Disorder-7 (GAD-7) is a brief self-report measure that assesses symptoms of anxiety over the course of the last two weeks. Mirabelle obtained a score of 2 suggesting minimal anxiety. Jomaira finds the endorsed symptoms to be not difficult at all. [0= Not at all; 1= Several days; 2= Over half the days; 3= Nearly every day] Feeling nervous, anxious, on edge 0  Not being able to stop or control worrying 1  Worrying too much about different things 0  Trouble relaxing 0  Being so restless that it's hard  to sit still 0  Becoming easily annoyed or irritable 1  Feeling afraid as if something awful might happen 0  GAD-7 Score 2   Interventions:  Conducted a chart review Focused on rapport building Verbally administered PHQ-9 and GAD-7 for symptom monitoring Verbally administered Food & Mood questionnaire to assess various behaviors related to emotional eating Provided emphatic reflections and validation Collaborated with patient on a treatment goal  Psychoeducation provided regarding physical versus emotional hunger  Provisional DSM-5 Diagnosis(es): 307.59 (F50.8) Other Specified Feeding or Eating Disorder, Emotional Eating Behaviors  Plan: Julius appears able and willing to participate as evidenced by collaboration on a treatment goal, engagement in reciprocal conversation, and asking questions as needed for clarification. Based on appointment availability and Sylver's schedule, the next appointment will be scheduled in approximately three weeks, which will be via MyChart Video Visit. The following treatment goal was established: increase coping skills. This provider will regularly review the treatment plan and medical chart to keep informed of status changes. Krisalyn expressed understanding and agreement with the initial treatment plan of care. Jamina will be sent a handout via e-mail to utilize between now and the next appointment to increase awareness of hunger patterns and subsequent eating. Audie provided verbal consent during today's appointment for this provider to send the handout via e-mail.

## 2020-02-03 NOTE — Progress Notes (Addendum)
Dear Tracy Arnold,   Thank you for referring Tracy Arnold to our clinic. The following note includes my evaluation and treatment recommendations.  Chief Complaint:   OBESITY Tracy Arnold (MR# [id number]) is a 34 y.o. female who presents for evaluation and treatment of obesity and related comorbidities. Current BMI is Body mass index is 30.82 kg/m. Aneli has been struggling with her weight for many years and has been unsuccessful in either losing weight, maintaining weight loss, or reaching her healthy weight goal.  Adalia is currently in the action stage of change and ready to dedicate time achieving and maintaining a healthier weight. Justa is interested in becoming our patient and working on intensive lifestyle modifications including (but not limited to) diet and exercise for weight loss.  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  She is engaged and will be getting married on August 21.  She currently lives with her parents.  She says she gets in 5000-10,000 steps daily.  She is Hindu and follows a vegetarian diet.  Hiba's habits were reviewed today and are as follows: Her family eats meals together, she thinks her family will eat healthier with her, her desired weight loss is 25+ pounds, she started gaining weight within the last 3-4 years, her heaviest weight ever was 180 pounds, she craves [place] food and sweets, she skips lunch sometimes, she follows a vegetarian diet, she is frequently drinking liquids with calories and she frequently makes poor food choices.  Depression Screen Jaycee's Food and Mood (modified PHQ-9) score was 8.  Depression screen [facility] 2/9 02/03/2020  Decreased Interest 2  Down, Depressed, Hopeless 1  PHQ - 2 Score 3  Altered sleeping 2  Tired, decreased energy 2  Change in appetite 1  Feeling bad or failure about yourself  0  Trouble concentrating 0  Moving slowly or fidgety/restless 0  Suicidal thoughts 0  PHQ-9 Score 8  Difficult doing work/chores Not difficult  at all   Subjective:   1. Other fatigue Terisha admits to daytime somnolence and reports waking up still tired. Patent has a history of symptoms of daytime fatigue, morning fatigue and snoring. Shifra generally gets 4-6 hours of sleep per night, and states that she has poor quality sleep. Snoring is present. Apneic episodes are not present. Epworth Sleepiness Score is 9.  2. SOB (shortness of breath) on exertion Zikeria notes increasing shortness of breath with exercising and seems to be worsening over time with weight gain. She notes getting out of breath sooner with activity than she used to. This has gotten worse recently. Yarexi denies shortness of breath at rest or orthopnea.  3. Hypothyroidism, unspecified type Metta takes levothyroxine 25 mcg daily.  This was started 3 weeks ago.  Lab Results  Component Value Date   TSH 19.02 (H) 01/12/2020   4. Essential hypertension Adaira takes metoprolol and Norvasc daily.  She takes HCTZ as needed for edema.  5. Tear of left acetabular labrum, initial encounter Sherle fell off a ladder and injured her hip.  She is followed by [facility], Dr. Cleophas Dunker.  She went to [employer] last week.  She is taking meloxicam and Zanaflex as needed.  6. Snoring Situation Chance of Dozing or Sleeping  Sitting and reading 2 = moderate chance of dozing or sleeping  Watching television 2 = moderate chance of dozing or sleeping  Sitting as a passenger in a car for an hour 2 = moderate chance of dozing or sleeping  Sitting  inactive in a public place (theater or meeting) 0 = would never doze or sleep  Lying down in the afternoon when circumstances permit 2 = moderate chance of dozing or sleeping  Sitting and talking to someone 0 = would never doze or sleep  Sitting quietly after lunch without alcohol 1 = slight chance of dozing or sleeping  In a car, while stopped for a few minutes in traffic 0 = would never doze or sleep  TOTAL 9   7. Depression screening Mazie was  screened for depression as part of her new patient workup today.  PHQ-9 is 8.  8. At risk for heart disease Kimoni is at a higher than average risk for cardiovascular disease due to obesity.   Assessment/Plan:   1. Other fatigue Sofya does feel that her weight is causing her energy to be lower than it should be. Fatigue may be related to obesity, depression or many other causes. Labs will be ordered, and in the meanwhile, Elaisha will focus on self care including making healthy food choices, increasing physical activity and focusing on stress reduction.  Orders - EKG 12-Lead - Comprehensive metabolic panel - Hemoglobin A1c - Insulin, random - VITAMIN D 25 Hydroxy (Vit-D Deficiency, Fractures) - Anemia panel  2. SOB (shortness of breath) on exertion Erikka does feel that she gets out of breath more easily that she used to when she exercises. Ahlana's shortness of breath appears to be obesity related and exercise induced. She has agreed to work on weight loss and gradually increase exercise to treat her exercise induced shortness of breath. Will continue to monitor closely.  Orders - CBC with Differential/Platelet  3. Hypothyroidism, unspecified type Patient with long-standing hypothyroidism, on levothyroxine therapy. She appears euthyroid. Orders and follow up as documented in patient record.  Counseling . Good thyroid control is important for overall health. Supratherapeutic thyroid levels are dangerous and will not improve weight loss results. . The correct way to take levothyroxine is fasting, with water, separated by at least 30 minutes from breakfast, and separated by more than 4 hours from calcium, iron, multivitamins, acid reflux medications (PPIs).   Orders - TSH - T4, free - T3  4. Essential hypertension Kelsa is working on healthy weight loss and exercise to improve blood pressure control. We will watch for signs of hypotension as she continues her lifestyle  modifications.  Orders - Lipid panel  5. Tear of left acetabular labrum, initial encounter Will follow because mobility and pain control are important for weight management.  6. Snoring Will refer to Sleep Medicine for evaluation.  Orders - Ambulatory referral to Neurology  7. Depression screening Adrienne had a positive depression screening. Depression is commonly associated with obesity and often results in emotional eating behaviors. We will monitor this closely and work on CBT to help improve the non-hunger eating patterns. Referral to Psychology may be required if no improvement is seen as she continues in our clinic.  8. At risk for heart disease Ayeisha was given approximately 15 minutes of coronary artery disease prevention counseling today. She is 34 y.o. female and has risk factors for heart disease including obesity. We discussed intensive lifestyle modifications today with an emphasis on specific weight loss instructions and strategies.   Repetitive spaced learning was employed today to elicit superior memory formation and behavioral change.  9. Class 1 obesity with serious comorbidity and body mass index (BMI) of 31.0 to 31.9 in adult, unspecified obesity type  Jalyiah is currently in the  action stage of change and her goal is to continue with weight loss efforts. I recommend Jadesola begin the structured treatment plan as follows:  She has agreed to the Vegetarian Plan.  Exercise goals: No exercise has been prescribed at this time.   Behavioral modification strategies: increasing lean protein intake, increasing water intake and decreasing liquid calories.  She was informed of the importance of frequent follow-up visits to maximize her success with intensive lifestyle modifications for her multiple health conditions. She was informed we would discuss her lab results at her next visit unless there is a critical issue that needs to be addressed sooner. Alba agreed to keep her next visit  at the agreed upon time to discuss these results.  Objective:   Blood pressure 123/85, pulse 83, temperature 97.9 F (36.6 C), temperature source Oral, height 5\' 3"  (1.6 m), weight 174 lb (78.9 kg), last menstrual period 12/15/2019, SpO2 100 %. Body mass index is 30.82 kg/m.  EKG: Normal sinus rhythm, rate 83 bpm.  Indirect Calorimeter completed today shows a VO2 of 218 and a REE of 1519.  Her calculated basal metabolic rate is 02/14/2020 thus her basal metabolic rate is better than expected.  General: Cooperative, alert, well developed, in no acute distress. HEENT: Conjunctivae and lids unremarkable. Cardiovascular: Regular rhythm.  Lungs: Normal work of breathing. Neurologic: No focal deficits.   Lab Results  Component Value Date   CREATININE 0.82 07/23/2019   BUN 13 07/23/2019   NA 138 07/23/2019   K 4.5 07/23/2019   CL 105 07/23/2019   CO2 26 07/23/2019   Lab Results  Component Value Date   ALT 12 07/23/2019   AST 12 07/23/2019   ALKPHOS 39 07/23/2019   BILITOT 0.4 07/23/2019   Lab Results  Component Value Date   HGBA1C 5.2 07/23/2019   Lab Results  Component Value Date   TSH 19.02 (H) 01/12/2020   Lab Results  Component Value Date   CHOL 180 07/23/2019   HDL 64.10 07/23/2019   LDLCALC 98 07/23/2019   TRIG 88.0 07/23/2019   CHOLHDL 3 07/23/2019   Lab Results  Component Value Date   WBC 4.2 07/23/2019   HGB 13.0 07/23/2019   HCT 39.4 07/23/2019   MCV 82.0 07/23/2019   PLT 227.0 07/23/2019   Attestation Statements:   This is the patient's first visit at [facility]. The patient's NEW PATIENT PACKET was reviewed at length. Included in the packet: current and past health history, medications, allergies, ROS, gynecologic history (women only), surgical history, family history, social history, weight history, weight loss surgery history (for those that have had weight loss surgery), nutritional evaluation, mood and food questionnaire, PHQ9, Epworth  questionnaire, sleep habits questionnaire, patient life and health improvement goals questionnaire. These will all be scanned into the patient's chart under media.   During the visit, I independently reviewed the patient's EKG, bioimpedance scale results, and indirect calorimeter results. I used this information to tailor a meal plan for the patient that will help her to lose weight and will improve her obesity-related conditions going forward. I performed a medically necessary appropriate examination and/or evaluation. I discussed the assessment and treatment plan with the patient. The patient was provided an opportunity to ask questions and all were answered. The patient agreed with the plan and demonstrated an understanding of the instructions. Labs were ordered at this visit and will be reviewed at the next visit unless more critical results need to be addressed immediately. Clinical information  was updated and documented in the EMR.   I, Insurance claims handler, CMA, am acting as Energy manager for [employer], DO.  I have reviewed the above documentation for accuracy and completeness, and I agree with the above. Helane Rima, DO

## 2020-02-04 LAB — ANEMIA PANEL
Ferritin: 20 ng/mL (ref 15–150)
Folate, Hemolysate: 333 ng/mL
Folate, RBC: 830 ng/mL (ref >498)
Hematocrit: 40.1 % (ref 34.0–46.6)
Iron Saturation: 18 % (ref 15–55)
Iron: 72 ug/dL (ref 27–159)
Retic Ct Pct: 1.2 % (ref 0.6–2.6)
Total Iron Binding Capacity: 399 ug/dL (ref 250–450)
UIBC: 327 ug/dL (ref 131–425)
Vitamin B-12: 245 pg/mL (ref 232–1245)

## 2020-02-04 LAB — HEMOGLOBIN A1C
Est. average glucose Bld gHb Est-mCnc: 105 mg/dL
Hgb A1c MFr Bld: 5.3 % (ref 4.8–5.6)

## 2020-02-04 LAB — COMPREHENSIVE METABOLIC PANEL
ALT: 17 IU/L (ref 0–32)
AST: 24 IU/L (ref 0–40)
Albumin/Globulin Ratio: 2.2 (ref 1.2–2.2)
Albumin: 4.8 g/dL (ref 3.8–4.8)
Alkaline Phosphatase: 43 IU/L — ABNORMAL LOW (ref 48–121)
BUN/Creatinine Ratio: 16 (ref 9–23)
BUN: 12 mg/dL (ref 6–20)
Bilirubin Total: 0.3 mg/dL (ref 0.0–1.2)
CO2: 21 mmol/L (ref 20–29)
Calcium: 9.9 mg/dL (ref 8.7–10.2)
Chloride: 104 mmol/L (ref 96–106)
Creatinine, Ser: 0.75 mg/dL (ref 0.57–1.00)
GFR calc Af Amer: 122 mL/min/{1.73_m2} (ref >59)
GFR calc non Af Amer: 106 mL/min/{1.73_m2} (ref >59)
Globulin, Total: 2.2 g/dL (ref 1.5–4.5)
Glucose: 85 mg/dL (ref 65–99)
Potassium: 4.5 mmol/L (ref 3.5–5.2)
Sodium: 139 mmol/L (ref 134–144)
Total Protein: 7 g/dL (ref 6.0–8.5)

## 2020-02-04 LAB — T3: T3, Total: 125 ng/dL (ref 71–180)

## 2020-02-04 LAB — TSH: TSH: 5.85 u[IU]/mL — ABNORMAL HIGH (ref 0.450–4.500)

## 2020-02-04 LAB — CBC WITH DIFFERENTIAL/PLATELET
Basophils Absolute: 0 10*3/uL (ref 0.0–0.2)
Basos: 1 %
EOS (ABSOLUTE): 0.2 10*3/uL (ref 0.0–0.4)
Eos: 3 %
Hemoglobin: 12.8 g/dL (ref 11.1–15.9)
Immature Grans (Abs): 0 10*3/uL (ref 0.0–0.1)
Immature Granulocytes: 0 %
Lymphocytes Absolute: 1.6 10*3/uL (ref 0.7–3.1)
Lymphs: 32 %
MCH: 28.3 pg (ref 26.6–33.0)
MCHC: 31.9 g/dL (ref 31.5–35.7)
MCV: 89 fL (ref 79–97)
Monocytes Absolute: 0.3 10*3/uL (ref 0.1–0.9)
Monocytes: 6 %
Neutrophils Absolute: 2.8 10*3/uL (ref 1.4–7.0)
Neutrophils: 58 %
Platelets: 242 10*3/uL (ref 150–450)
RBC: 4.52 x10E6/uL (ref 3.77–5.28)
RDW: 13.4 % (ref 11.7–15.4)
WBC: 4.9 10*3/uL (ref 3.4–10.8)

## 2020-02-04 LAB — LIPID PANEL
Chol/HDL Ratio: 2.7 ratio (ref 0.0–4.4)
Cholesterol, Total: 184 mg/dL (ref 100–199)
HDL: 67 mg/dL (ref >39)
LDL Chol Calc (NIH): 98 mg/dL (ref 0–99)
Triglycerides: 110 mg/dL (ref 0–149)
VLDL Cholesterol Cal: 19 mg/dL (ref 5–40)

## 2020-02-04 LAB — T4, FREE: Free T4: 1.27 ng/dL (ref 0.82–1.77)

## 2020-02-04 LAB — VITAMIN D 25 HYDROXY (VIT D DEFICIENCY, FRACTURES): Vit D, 25-Hydroxy: 13.6 ng/mL — ABNORMAL LOW (ref 30.0–100.0)

## 2020-02-04 LAB — INSULIN, RANDOM: INSULIN: 20 u[IU]/mL (ref 2.6–24.9)

## 2020-02-05 ENCOUNTER — Encounter (INDEPENDENT_AMBULATORY_CARE_PROVIDER_SITE_OTHER): Payer: Self-pay | Admitting: Family Medicine

## 2020-02-10 ENCOUNTER — Telehealth (INDEPENDENT_AMBULATORY_CARE_PROVIDER_SITE_OTHER): Payer: 59 | Admitting: Psychology

## 2020-02-10 ENCOUNTER — Other Ambulatory Visit: Payer: Self-pay

## 2020-02-10 DIAGNOSIS — F5089 Other specified eating disorder: Secondary | ICD-10-CM

## 2020-02-10 NOTE — Telephone Encounter (Signed)
Please review

## 2020-02-11 MED FILL — LEVOTHYROXINE SODIUM 25 MCG: 25 | 30 days supply | Qty: 30 | Fill #1

## 2020-02-13 ENCOUNTER — Other Ambulatory Visit: Payer: Self-pay | Admitting: Family Medicine

## 2020-02-13 MED ORDER — VITAMIN D (ERGOCALCIFEROL) 1.25 MG (50000 UNIT) PO CAPS: 50000.0000 [IU] | ORAL_CAPSULE | ORAL | 0 refills | Status: AC

## 2020-02-13 MED FILL — VIT D2 1.25 MG (50,000 UNIT: 1.25 MG | 28 days supply | Qty: 4 | Fill #0

## 2020-02-17 ENCOUNTER — Other Ambulatory Visit: Payer: Self-pay

## 2020-02-17 ENCOUNTER — Encounter (INDEPENDENT_AMBULATORY_CARE_PROVIDER_SITE_OTHER): Payer: Self-pay | Admitting: Family Medicine

## 2020-02-17 ENCOUNTER — Ambulatory Visit (INDEPENDENT_AMBULATORY_CARE_PROVIDER_SITE_OTHER): Payer: 59 | Admitting: Family Medicine

## 2020-02-17 VITALS — BP 110/77 | HR 83 | Temp 98.5°F | Ht 63.0 in | Wt 173.0 lb

## 2020-02-17 DIAGNOSIS — Z9189 Other specified personal risk factors, not elsewhere classified: Secondary | ICD-10-CM

## 2020-02-17 DIAGNOSIS — Z6830 Body mass index (BMI) 30.0-30.9, adult: Secondary | ICD-10-CM | POA: Diagnosis not present

## 2020-02-17 DIAGNOSIS — E669 Obesity, unspecified: Secondary | ICD-10-CM | POA: Diagnosis not present

## 2020-02-17 DIAGNOSIS — E559 Vitamin D deficiency, unspecified: Secondary | ICD-10-CM | POA: Diagnosis not present

## 2020-02-17 DIAGNOSIS — E8881 Metabolic syndrome: Secondary | ICD-10-CM | POA: Diagnosis not present

## 2020-02-17 DIAGNOSIS — E039 Hypothyroidism, unspecified: Secondary | ICD-10-CM

## 2020-02-17 NOTE — Progress Notes (Signed)
Chief Complaint:   OBESITY Tracy Arnold is here to discuss her progress with her obesity treatment plan along with follow-up of her obesity related diagnoses. Tracy Arnold is on the [place] and states she is following her eating plan approximately 60% of the time. Tracy Arnold states she is doing [employer] for 45 minutes 2 times per week.  Today's visit was #: 2 Starting weight: 174 lbs Starting date: 02/03/2020 Today's weight: 173 lbs Today's date: 02/17/2020 Total lbs lost to date: 1 lb Total lbs lost since last in-office visit: 1 lb  Interim History: Tracy Arnold started nuts/PB.  She says it is an acquired taste.  She has decreased Chai tea, but says she is now having more headaches.  She likes Fairlife milk.  She says her average protein intake is 2 grams per day.  We discussed [employer] Meal Prep, Clean [place]atz, and a protein shake daily.  Subjective:   1. Insulin resistance Tracy Arnold has a diagnosis of insulin resistance based on her elevated fasting insulin level >5. She continues to work on diet and exercise to decrease her risk of diabetes.  Lab Results  Component Value Date   INSULIN 20.0 02/03/2020   Lab Results  Component Value Date   HGBA1C 5.3 02/03/2020   2. Vitamin D deficiency Tracy Arnold's Vitamin D level was 13.5 on 02/03/2020. She is currently taking prescription vitamin D 50,000 IU each week. She denies nausea, vomiting or muscle weakness.  3. Hypothyroidism, unspecified type Tracy Arnold takes levothyroxine 25 mcg daily for her hypothyroidism.  There has been a significant change in her TSH in 2 weeks.    Lab Results  Component Value Date   TSH 5.850 (H) 02/03/2020   Assessment/Plan:   1. Insulin resistance Tracy Arnold will continue to work on weight loss, exercise, and decreasing simple carbohydrates to help decrease the risk of diabetes. Tracy Arnold agreed to follow-up with [place] as directed to closely monitor her progress.  We discussed GLP-1 RA.  2. Vitamin D deficiency Low Vitamin D level  contributes to fatigue and are associated with obesity, breast, and colon cancer. She agrees to start to take prescription Vitamin D @50 ,000 IU every week and will follow-up for routine testing of Vitamin D, at least 2-3 times per year to avoid over-replacement.  3. Hypothyroidism, unspecified type Patient with long-standing hypothyroidism, on levothyroxine therapy. She appears euthyroid. Orders and follow up as documented in patient record.  Will not change dose yet.  Will recheck labs at next visit.  Counseling . Good thyroid control is important for overall health. Supratherapeutic thyroid levels are dangerous and will not improve weight loss results. . The correct way to take levothyroxine is fasting, with water, separated by at least 30 minutes from breakfast, and separated by more than 4 hours from calcium, iron, multivitamins, acid reflux medications (PPIs).   4. At risk for constipation Jahira was informed that a decrease in bowel movement frequency is normal while losing weight, but stools should not be hard or painful. Orders and follow up as documented in patient record.   Counseling Getting to Good Bowel Health: Your goal is to have one soft bowel movement each day. Drink at least 8 glasses of water each day. Eat plenty of fiber (goal is over 25 grams each day). It is best to get most of your fiber from dietary sources which includes leafy green vegetables, fresh fruit, and whole grains. You may need to add fiber with the help of OTC fiber supplements. These include  Metamucil, Citrucel, and Flaxseed. If you are still having trouble, try adding Miralax or Magnesium Citrate. If all of these changes do not work, Dietitian.  5. Class 1 obesity with serious comorbidity and body mass index (BMI) of 30.0 to 30.9 in adult, unspecified obesity type Tracy Arnold is currently in the action stage of change. As such, her goal is to continue with weight loss efforts. She has agreed to the Vegetarian  Plan.   Exercise goals: For substantial health benefits, adults should do at least 150 minutes (2 hours and 30 minutes) a week of moderate-intensity, or 75 minutes (1 hour and 15 minutes) a week of vigorous-intensity aerobic physical activity, or an equivalent combination of moderate- and vigorous-intensity aerobic activity. Aerobic activity should be performed in episodes of at least 10 minutes, and preferably, it should be spread throughout the week.  Behavioral modification strategies: increasing lean protein intake.  Tracy Arnold has agreed to follow-up with our clinic in 2 weeks. She was informed of the importance of frequent follow-up visits to maximize her success with intensive lifestyle modifications for her multiple health conditions.   Objective:   Blood pressure 110/77, pulse 83, temperature 98.5 F (36.9 C), temperature source Oral, height 5\' 3"  (1.6 m), weight 173 lb (78.5 kg), SpO2 100 %. Body mass index is 30.65 kg/m.  General: Cooperative, alert, well developed, in no acute distress. HEENT: Conjunctivae and lids unremarkable. Cardiovascular: Regular rhythm.  Lungs: Normal work of breathing. Neurologic: No focal deficits.   Lab Results  Component Value Date   CREATININE 0.75 02/03/2020   BUN 12 02/03/2020   NA 139 02/03/2020   K 4.5 02/03/2020   CL 104 02/03/2020   CO2 21 02/03/2020   Lab Results  Component Value Date   ALT 17 02/03/2020   AST 24 02/03/2020   ALKPHOS 43 (L) 02/03/2020   BILITOT 0.3 02/03/2020   Lab Results  Component Value Date   HGBA1C 5.3 02/03/2020   HGBA1C 5.2 07/23/2019   Lab Results  Component Value Date   INSULIN 20.0 02/03/2020   Lab Results  Component Value Date   TSH 5.850 (H) 02/03/2020   Lab Results  Component Value Date   CHOL 184 02/03/2020   HDL 67 02/03/2020   LDLCALC 98 02/03/2020   TRIG 110 02/03/2020   CHOLHDL 2.7 02/03/2020   Lab Results  Component Value Date   WBC 4.9 02/03/2020   HGB 12.8 02/03/2020   HCT  40.1 02/03/2020   MCV 89 02/03/2020   PLT 242 02/03/2020   Lab Results  Component Value Date   IRON 72 02/03/2020   TIBC 399 02/03/2020   FERRITIN 20 02/03/2020   Attestation Statements:   Reviewed by clinician on day of visit: allergies, medications, problem list, medical history, surgical history, family history, social history, and previous encounter notes.  I, 02/05/2020, CMA, am acting as transcriptionist for Insurance claims handler, DO  I have reviewed the above documentation for accuracy and completeness, and I agree with the above. Helane Rima, DO

## 2020-02-18 NOTE — Progress Notes (Signed)
  Office: [phone]  /  Fax: [phone]    Date: March 03, 2020   Appointment Start Time: 10:00am Duration: 29 minutes Provider: Lawerance Cruel, Psy.D. Type of Session: Individual Therapy  Location of Patient: Home Location of Provider: Provider's Home Type of Contact: Telepsychological Visit via MyChart Video Visit  Session Content: Tracy Arnold is a 34 y.o. female presenting via MyChart Video Visit for a follow-up appointment to address the previously established treatment goal of increasing coping skills. Today's appointment was a telepsychological visit due to COVID-19. Tracy Arnold provided verbal consent for today's telepsychological appointment and she is aware she is responsible for securing confidentiality on her end of the session. Prior to proceeding with today's appointment, Tracy Arnold's physical location at the time of this appointment was obtained as well a phone number she could be reached at in the event of technical difficulties. Tracy Arnold and this provider participated in today's telepsychological service.   This provider conducted a brief check-in. Tracy Arnold shared about recent events, including traveling and not feeling well. She described feeling frustrated with balancing ongoing stressors and eating congruent to her meal plan. This was further explored and processed. She was receptive to packing snacks, especially with protein, when going work. Psychoeducation regarding triggers for emotional eating was provided. Tracy Arnold was provided a handout, and encouraged to utilize the handout between now and the next appointment to increase awareness of triggers and frequency. Tracy Arnold agreed. This provider also discussed behavioral strategies for specific triggers, such as placing the utensil down when conversing to avoid mindless eating. Tracy Arnold provided verbal consent during today's appointment for this provider to send a handout about triggers via e-mail. Tracy Arnold was receptive to today's appointment as evidenced by openness to  sharing, responsiveness to feedback, and willingness to explore triggers for emotional eating.  Mental Status Examination:  Appearance: well groomed and appropriate hygiene  Behavior: appropriate to circumstances Mood: euthymic Affect: mood congruent Speech: normal in rate, volume, and tone Eye Contact: appropriate Psychomotor Activity: appropriate Gait: unable to assess Thought Process: linear, logical, and goal directed  Thought Content/Perception: no hallucinations, delusions, bizarre thinking or behavior reported or observed and no evidence of suicidal and homicidal ideation, plan, and intent Orientation: time, person, place, and purpose of appointment Memory/Concentration: memory, attention, language, and fund of knowledge intact  Insight/Judgment: good  Interventions:  Conducted a brief chart review Provided empathic reflections and validation Employed supportive psychotherapy interventions to facilitate reduced distress and to improve coping skills with identified stressors Employed motivational interviewing skills to assess patient's willingness/desire to adhere to recommended medical treatments and assignments Psychoeducation provided regarding triggers for emotional eating  DSM-5 Diagnosis(es): 307.59 (F50.8) Other Specified Feeding or Eating Disorder, Emotional Eating Behaviors  Treatment Goal & Progress: During the initial appointment with this provider, the following treatment goal was established: increase coping skills. Tracy Arnold has demonstrated progress in her goal as evidenced by increased awareness of hunger patterns.   Plan: The next appointment will be scheduled in approximately two weeks, which will be via MyChart Video Visit. The next session will focus on working towards the established treatment goal.

## 2020-03-03 ENCOUNTER — Other Ambulatory Visit: Payer: Self-pay

## 2020-03-03 ENCOUNTER — Telehealth (INDEPENDENT_AMBULATORY_CARE_PROVIDER_SITE_OTHER): Payer: 59 | Admitting: Psychology

## 2020-03-03 DIAGNOSIS — F5089 Other specified eating disorder: Secondary | ICD-10-CM

## 2020-03-08 ENCOUNTER — Ambulatory Visit (INDEPENDENT_AMBULATORY_CARE_PROVIDER_SITE_OTHER): Payer: 59 | Admitting: Family Medicine

## 2020-03-08 ENCOUNTER — Other Ambulatory Visit: Payer: Self-pay

## 2020-03-08 ENCOUNTER — Encounter (INDEPENDENT_AMBULATORY_CARE_PROVIDER_SITE_OTHER): Payer: Self-pay | Admitting: Family Medicine

## 2020-03-08 VITALS — BP 103/74 | HR 97 | Temp 97.9°F | Ht 63.0 in | Wt 173.0 lb

## 2020-03-08 DIAGNOSIS — I10 Essential (primary) hypertension: Secondary | ICD-10-CM

## 2020-03-08 DIAGNOSIS — E559 Vitamin D deficiency, unspecified: Secondary | ICD-10-CM | POA: Diagnosis not present

## 2020-03-08 DIAGNOSIS — E669 Obesity, unspecified: Secondary | ICD-10-CM | POA: Diagnosis not present

## 2020-03-08 DIAGNOSIS — Z6830 Body mass index (BMI) 30.0-30.9, adult: Secondary | ICD-10-CM | POA: Diagnosis not present

## 2020-03-08 DIAGNOSIS — E038 Other specified hypothyroidism: Secondary | ICD-10-CM

## 2020-03-08 NOTE — Progress Notes (Signed)
Chief Complaint:   OBESITY Tracy Arnold is here to discuss her progress with her obesity treatment plan along with follow-up of her obesity related diagnoses. Tracy Arnold is on the [place] and states she is following her eating plan approximately 60% of the time. Tracy Arnold states she is doing [employer] for 45 minutes 2-3 times per week.  Today's visit was #: 3 Starting weight: 174 lbs Starting date: 02/03/2020 Today's weight: 173 lbs Today's date: 03/08/2020 Total lbs lost to date: 1 lb Total lbs lost since last in-office visit: 0  Interim History: Tracy Arnold says this is her last week at work.  She is emotional, but excited.  She says she met with Dr. Mallie Mussel, and found it helpful.  Subjective:   1. Essential hypertension Review: taking medications as instructed, no medication side effects noted, no chest pain on exertion, no dyspnea on exertion, no swelling of ankles.   BP Readings from Last 3 Encounters:  03/08/20 103/74  02/17/20 110/77  02/03/20 123/85   2. Vitamin D deficiency Tracy Arnold's Vitamin D level was 13.6 on 02/03/2020. She is currently taking prescription vitamin D 50,000 IU each week. She denies nausea, vomiting or muscle weakness.  3. Other specified hypothyroidism Tracy Arnold is taking levothyroxine 25 mcg daily.  Lab Results  Component Value Date   TSH 5.850 (H) 02/03/2020   Assessment/Plan:   1. Essential hypertension Tracy Arnold is working on healthy weight loss and exercise to improve blood pressure control. We will watch for signs of hypotension as she continues her lifestyle modifications.  2. Vitamin D deficiency Low Vitamin D level contributes to fatigue and are associated with obesity, breast, and colon cancer. She agrees to continue to take prescription Vitamin D @50 ,000 IU every week and will follow-up for routine testing of Vitamin D, at least 2-3 times per year to avoid over-replacement.  3. Other specified hypothyroidism Patient with long-standing hypothyroidism, on  levothyroxine therapy. She appears euthyroid. Orders and follow up as documented in patient record.  Counseling . Good thyroid control is important for overall health. Tracy Arnold thyroid levels are dangerous and will not improve weight loss results. . The correct way to take levothyroxine is fasting, with water, separated by at least 30 minutes from breakfast, and separated by more than 4 hours from calcium, iron, multivitamins, acid reflux medications (PPIs).   4. Class 1 obesity with serious comorbidity and body mass index (BMI) of 30.0 to 30.9 in adult, unspecified obesity type Tracy Arnold is currently in the action stage of change. As such, her goal is to continue with weight loss efforts. She has agreed to the Tracy Arnold.   Exercise goals: For substantial health benefits, adults should do at least 150 minutes (2 hours and 30 minutes) a week of moderate-intensity, or 75 minutes (1 hour and 15 minutes) a week of vigorous-intensity aerobic physical activity, or an equivalent combination of moderate- and vigorous-intensity aerobic activity. Aerobic activity should be performed in episodes of at least 10 minutes, and preferably, it should be spread throughout the week.  Behavioral modification strategies: increasing lean protein intake and increasing water intake.  Tracy Arnold has agreed to follow-up with our clinic in 3 weeks. She was informed of the importance of frequent follow-up visits to maximize her success with intensive lifestyle modifications for her multiple health conditions.   Objective:   Blood pressure 103/74, pulse 97, temperature 97.9 F (36.6 C), temperature source Oral, height 5' 3"  (1.6 m), weight 173 lb (78.5 kg), SpO2 100 %. Body mass  index is 30.65 kg/m.  General: Cooperative, alert, well developed, in no acute distress. HEENT: Conjunctivae and lids unremarkable. Cardiovascular: Regular rhythm.  Lungs: Normal work of breathing. Neurologic: No focal deficits.   Lab  Results  Component Value Date   CREATININE 0.75 02/03/2020   BUN 12 02/03/2020   NA 139 02/03/2020   K 4.5 02/03/2020   CL 104 02/03/2020   CO2 21 02/03/2020   Lab Results  Component Value Date   ALT 17 02/03/2020   AST 24 02/03/2020   ALKPHOS 43 (L) 02/03/2020   BILITOT 0.3 02/03/2020   Lab Results  Component Value Date   HGBA1C 5.3 02/03/2020   HGBA1C 5.2 07/23/2019   Lab Results  Component Value Date   INSULIN 20.0 02/03/2020   Lab Results  Component Value Date   TSH 5.850 (H) 02/03/2020   Lab Results  Component Value Date   CHOL 184 02/03/2020   HDL 67 02/03/2020   LDLCALC 98 02/03/2020   TRIG 110 02/03/2020   CHOLHDL 2.7 02/03/2020   Lab Results  Component Value Date   WBC 4.9 02/03/2020   HGB 12.8 02/03/2020   HCT 40.1 02/03/2020   MCV 89 02/03/2020   PLT 242 02/03/2020   Lab Results  Component Value Date   IRON 72 02/03/2020   TIBC 399 02/03/2020   FERRITIN 20 02/03/2020   Attestation Statements:   Reviewed by clinician on day of visit: allergies, medications, problem list, medical history, surgical history, family history, social history, and previous encounter notes.  Time spent on visit including pre-visit chart review and post-visit care and documentation was 26 minutes.  I, Water quality scientist, CMA, am acting as transcriptionist for Briscoe Deutscher, DO  I have reviewed the above documentation for accuracy and completeness, and I agree with the above. Briscoe Deutscher, DO

## 2020-03-08 NOTE — Progress Notes (Signed)
  Office: [phone]  /  Fax: [phone]    Date: March 22, 2020   Appointment Start Time: 8:31am Duration: 28 minutes Provider: Lawerance Cruel, Psy.D. Type of Session: Individual Therapy  Location of Patient: Home Location of Provider: [facility] Type of Contact: Telepsychological Visit via MyChart Video Visit  Session Content: Tracy Arnold is a 34 y.o. female presenting via MyChart Video Visit for a follow-up appointment to address the previously established treatment goal of increasing coping skills. Today's appointment was a telepsychological visit due to COVID-19. Tracy Arnold provided verbal consent for today's telepsychological appointment and she is aware she is responsible for securing confidentiality on her end of the session. Prior to proceeding with today's appointment, Tracy Arnold's physical location at the time of this appointment was obtained as well a phone number she could be reached at in the event of technical difficulties. Tracy Arnold and this provider participated in today's telepsychological service.   This provider conducted a brief check-in. Tracy Arnold shared she is no longer working and about recent events. The impact of the aforementioned on eating was explored. She described making better choices and engaging in portion control. It was reflected she is likely not consuming enough protein while traveling. As such, this provider and [name] explored protein options (e.g., protein shake) she can take with her when traveling. Additionally, psychoeducation regarding SMART goals was provided and Tracy Arnold was engaged in goal setting. The following goal was established for dinner as it is reportedly the most challenging: Tracy Arnold will eat dinner congruent to her meal plan 5 out of 7 days every week between now and the next appointment with this provider. Tracy Arnold was receptive to today's appointment as evidenced by openness to sharing, responsiveness to feedback, and willingness to work toward the established  SMART goal.  Mental Status Examination:  Appearance: well groomed and appropriate hygiene  Behavior: appropriate to circumstances Mood: euthymic Affect: mood congruent Speech: normal in rate, volume, and tone Eye Contact: appropriate Psychomotor Activity: appropriate Gait: unable to assess Thought Process: linear, logical, and goal directed  Thought Content/Perception: no hallucinations, delusions, bizarre thinking or behavior reported or observed and no evidence of suicidal and homicidal ideation, plan, and intent Orientation: time, person, place, and purpose of appointment Memory/Concentration: memory, attention, language, and fund of knowledge intact  Insight/Judgment: good  Interventions:  Conducted a brief chart review Provided empathic reflections and validation Employed supportive psychotherapy interventions to facilitate reduced distress and to improve coping skills with identified stressors Engaged patient in problem solving Psychoeducation provided regarding SMART goals  Engaged patient in goal setting  DSM-5 Diagnosis(es): 307.59 (F50.8) Other Specified Feeding or Eating Disorder, Emotional Eating Behaviors  Treatment Goal & Progress: During the initial appointment with this provider, the following treatment goal was established: increase coping skills. Tracy Arnold has demonstrated progress in her goal as evidenced by increased awareness of hunger patterns and increased awareness of triggers for emotional eating. Tracy Arnold also continues to demonstrate willingness to engage in learned skill(s).  Plan: The next appointment will be scheduled in two weeks, which will be via MyChart Video Visit. The next session will focus on working towards the established treatment goal.

## 2020-03-22 ENCOUNTER — Telehealth (INDEPENDENT_AMBULATORY_CARE_PROVIDER_SITE_OTHER): Payer: 59 | Admitting: Psychology

## 2020-03-22 ENCOUNTER — Other Ambulatory Visit: Payer: Self-pay

## 2020-03-22 ENCOUNTER — Other Ambulatory Visit: Payer: Self-pay | Admitting: Medical

## 2020-03-22 DIAGNOSIS — F5089 Other specified eating disorder: Secondary | ICD-10-CM | POA: Diagnosis not present

## 2020-03-22 MED FILL — LEVOTHYROXINE SODIUM 25 MCG: 25 | 90 days supply | Qty: 90 | Fill #0

## 2020-03-22 NOTE — Progress Notes (Unsigned)
Office: [phone]  /  Fax: [phone]    Date: April 05, 2020   Appointment Start Time: *** Duration: *** minutes Provider: Lawerance Cruel, Psy.D. Type of Session: Individual Therapy  Location of Patient: {gbptloc:23249} Location of Provider: {Location of Service:22491} Type of Contact: Telepsychological Visit via MyChart Video Visit  Session Content: Tracy Arnold is a 34 y.o. female presenting via MyChart Video Visit for a follow-up appointment to address the previously established treatment goal of increasing coping skills. Today's appointment was a telepsychological visit due to COVID-19. Tracy Arnold provided verbal consent for today's telepsychological appointment and she is aware she is responsible for securing confidentiality on her end of the session. Prior to proceeding with today's appointment, Tracy Arnold's physical location at the time of this appointment was obtained as well a phone number she could be reached at in the event of technical difficulties. [place] and this provider participated in today's telepsychological service.   This provider conducted a brief check-in and verbally administered the PHQ-9 and GAD-7. *** Tracy Arnold was receptive to today's appointment as evidenced by openness to sharing, responsiveness to feedback, and {gbreceptiveness:23401}.  Mental Status Examination:  Appearance: {Appearance:22431} Behavior: {Behavior:22445} Mood: {gbmood:21757} Affect: {Affect:22436} Speech: {Speech:22432} Eye Contact: {Eye Contact:22433} Psychomotor Activity: {Motor Activity:22434} Gait: {gbgait:23404} Thought Process: {thought process:22448}  Thought Content/Perception: {disturbances:22451} Orientation: {Orientation:22437} Memory/Concentration: {gbcognition:22449} Insight/Judgment: {Insight:22446}  Structured Assessments Results: The Patient Health Questionnaire-9 (PHQ-9) is a self-report measure that assesses symptoms and severity of depression over the course of the last two weeks. Tracy Arnold  obtained a score of *** suggesting {GBPHQ9SEVERITY:21752}. Tracy Arnold finds the endorsed symptoms to be {gbphq9difficulty:21754}. [0= Not at all; 1= Several days; 2= More than half the days; 3= Nearly every day] Little interest or pleasure in doing things ***  Feeling down, depressed, or hopeless ***  Trouble falling or staying asleep, or sleeping too much ***  Feeling tired or having little energy ***  Poor appetite or overeating ***  Feeling bad about yourself --- or that you are a failure or have let yourself or your family down ***  Trouble concentrating on things, such as reading the newspaper or watching television ***  Moving or speaking so slowly that other people could have noticed? Or the opposite --- being so fidgety or restless that you have been moving around a lot more than usual ***  Thoughts that you would be better off dead or hurting yourself in some way ***  PHQ-9 Score ***    The Generalized Anxiety Disorder-7 (GAD-7) is a brief self-report measure that assesses symptoms of anxiety over the course of the last two weeks. Tracy Arnold obtained a score of *** suggesting {gbgad7severity:21753}. Tracy Arnold finds the endorsed symptoms to be {gbphq9difficulty:21754}. [0= Not at all; 1= Several days; 2= Over half the days; 3= Nearly every day] Feeling nervous, anxious, on edge ***  Not being able to stop or control worrying ***  Worrying too much about different things ***  Trouble relaxing ***  Being so restless that it's hard to sit still ***  Becoming easily annoyed or irritable ***  Feeling afraid as if something awful might happen ***  GAD-7 Score ***   Interventions:  {Interventions for Progress Notes:23405}  DSM-5 Diagnosis(es): 307.59 (F50.8) Other Specified Feeding or Eating Disorder, Emotional Eating Behaviors  Treatment Goal & Progress: During the initial appointment with this provider, the following treatment goal was established: increase coping skills. Tracy Arnold has demonstrated  progress in her goal as evidenced by {gbtxprogress:22839}. Tracy Arnold also {gbtxprogress2:22951}.  Plan: The next appointment will  be scheduled in {gbweeks:21758}, which will be {gbtxmodality:23402}. The next session will focus on {Plan for Next Appointment:23400}.

## 2020-03-23 ENCOUNTER — Encounter (INDEPENDENT_AMBULATORY_CARE_PROVIDER_SITE_OTHER): Payer: Self-pay | Admitting: Family Medicine

## 2020-03-29 ENCOUNTER — Other Ambulatory Visit (INDEPENDENT_AMBULATORY_CARE_PROVIDER_SITE_OTHER): Payer: 59

## 2020-03-29 ENCOUNTER — Other Ambulatory Visit: Payer: Self-pay

## 2020-03-29 DIAGNOSIS — E039 Hypothyroidism, unspecified: Secondary | ICD-10-CM

## 2020-03-30 ENCOUNTER — Telehealth: Payer: Self-pay | Admitting: Medical

## 2020-03-30 ENCOUNTER — Other Ambulatory Visit: Payer: Self-pay

## 2020-03-30 ENCOUNTER — Ambulatory Visit: Payer: 59 | Admitting: Medical

## 2020-03-30 VITALS — BP 120/84 | HR 105 | Temp 98.0°F | Resp 20 | Ht 63.0 in | Wt 175.6 lb

## 2020-03-30 DIAGNOSIS — N943 Premenstrual tension syndrome: Secondary | ICD-10-CM | POA: Diagnosis not present

## 2020-03-30 DIAGNOSIS — N926 Irregular menstruation, unspecified: Secondary | ICD-10-CM | POA: Diagnosis not present

## 2020-03-30 DIAGNOSIS — I10 Essential (primary) hypertension: Secondary | ICD-10-CM

## 2020-03-30 DIAGNOSIS — E038 Other specified hypothyroidism: Secondary | ICD-10-CM

## 2020-03-30 DIAGNOSIS — E039 Hypothyroidism, unspecified: Secondary | ICD-10-CM | POA: Diagnosis not present

## 2020-03-30 DIAGNOSIS — Z3009 Encounter for other general counseling and advice on contraception: Secondary | ICD-10-CM

## 2020-03-30 LAB — TSH: TSH: 4.9 u[IU]/mL — ABNORMAL HIGH (ref 0.35–4.50)

## 2020-03-30 LAB — T4, FREE: Free T4: 0.78 ng/dL (ref 0.60–1.60)

## 2020-03-30 NOTE — Patient Instructions (Addendum)
For family planning need, ha associated with cycle and irregular menses, will go ahead and refer you to gyn at the med center. They can do work up for POC. Also decide on oral contraceptive.  For hypothyroid will follow recent tsh and t4.  For htn continue current medication. Keep checking bp. If it is dropping then may need dose adjustment of amlodipine.  Good job on your weight loss. Keep up good work.  Follow up as needed. Congratulation on your upcoming wedding.

## 2020-03-30 NOTE — Progress Notes (Signed)
   Subjective:    Patient ID: Tracy Arnold, female    DOB: 1986/01/01, 34 y.o.   MRN: [id number]  HPI Pt in for follow up.  Pt has been seeing wt management clinic and also has been seeing psychologist. That has been helpful with weight loss.   Pt has lost some weight. Wedding gown have been fitting better. Pt getting married in about a month.  She states she quit her job. Plans on moving to [place] ,[place].   Pt feels more energetic and sleeping better.   Pt bp is also better. Pt states when she checks her bp is 115 or so systolic.   Pt thyroid studies are pending.She is on levothyroxine.  Pt has hx of some irregular cycles and recently with cycles she is getting ha. When younger she would get has with her cycles.   Her last cycle was 3 days ago. She wants to consider start on ocp.   Pt last pap was 2 years ago. Pt will have insurance. Pt indicates she wants to see gyn to evaluate for polycystic ovary disease.     Review of Systems See hpi.    Objective:   Physical Exam  General Mental Status- Alert. General Appearance- Not in acute distress.   Skin General: Color- Normal Color. Moisture- Normal Moisture.  Neck Carotid Arteries- Normal color. Moisture- Normal Moisture. No carotid bruits. No JVD.  Chest and Lung Exam Auscultation: Breath Sounds:-Normal.  Cardiovascular Auscultation:Rythm- Regular. Murmurs & Other Heart Sounds:Auscultation of the heart reveals- No Murmurs.  Abdomen Inspection:-Inspeection Normal. Palpation/Percussion:Note:No mass. Palpation and Percussion of the abdomen reveal- Non Tender, Non Distended + BS, no rebound or guarding.   Neurologic Cranial Nerve exam:- CN III-XII intact(No nystagmus), symmetric smile. Strength:- 5/5 equal and symmetric strength both upper and lower extremities.      Assessment & Plan:  For family planning need, ha associated with cycle and irregular menses, will go ahead and refer you to gyn at the med  center. They can do work up for POC. Also decide on oral contraceptive.  For hypothyroid will follow recent tsh and t4.  For htn continue current medication. Keep checking bp. If it is dropping then may need dose adjustment.  Good job on your weight loss. Keep up good work.  Follow up as needed. Congratulation on your upcoming wedding.   Esperanza Richters, PA-C   Time spent with patient today was 30  minutes which consisted of chart review, discussing diagnosis, treatment, referral  and documentation.

## 2020-03-31 ENCOUNTER — Encounter (INDEPENDENT_AMBULATORY_CARE_PROVIDER_SITE_OTHER): Payer: Self-pay | Admitting: Family Medicine

## 2020-03-31 ENCOUNTER — Ambulatory Visit (INDEPENDENT_AMBULATORY_CARE_PROVIDER_SITE_OTHER): Payer: 59 | Admitting: Family Medicine

## 2020-03-31 VITALS — BP 122/82 | HR 101 | Temp 98.0°F | Ht 63.0 in | Wt 171.0 lb

## 2020-03-31 DIAGNOSIS — Z6830 Body mass index (BMI) 30.0-30.9, adult: Secondary | ICD-10-CM

## 2020-03-31 DIAGNOSIS — E669 Obesity, unspecified: Secondary | ICD-10-CM | POA: Diagnosis not present

## 2020-03-31 DIAGNOSIS — M25552 Pain in left hip: Secondary | ICD-10-CM | POA: Diagnosis not present

## 2020-03-31 DIAGNOSIS — Z9189 Other specified personal risk factors, not elsewhere classified: Secondary | ICD-10-CM | POA: Diagnosis not present

## 2020-03-31 NOTE — Progress Notes (Signed)
Chief Complaint:   OBESITY Tracy Arnold is here to discuss her progress with her obesity treatment plan along with follow-up of her obesity related diagnoses. Tracy Arnold is on the Vegetarian Plan and states she is following her eating plan approximately 50-60% of the time. Tracy Arnold states she is doing cardio and strength training for 45-60 minutes 3 times per week.  Today's visit was #: 4 Starting weight: 174 lbs Starting date: 02/03/2020 Today's weight: 171 lbs Today's date: 03/31/2020 Total lbs lost to date: 3 lbs Total lbs lost since last in-office visit: 2 lbs  Interim History: Tracy Arnold says she survived her last week of work.  There is a party for her today at [employer].  Her wedding is in 1 month.  Subjective:   1. Left hip pain Tracy Arnold has history of a labrum tear, but now with increased anterior/hip flexor discomfort.  2. At risk for activity intolerance Tracy Arnold is at risk for exercise intolerance due to hip pain.  Assessment/Plan:   1. Left hip pain Will place referral for Tracy Arnold to see Dr. Katrinka Blazing in Sports Medicine.  - Ambulatory referral to Sports Medicine  2. At risk for activity intolerance Tracy Arnold was given approximately 15 minutes of exercise intolerance counseling today. She is 34 y.o. female and has risk factors exercise intolerance including obesity. We discussed intensive lifestyle modifications today with an emphasis on specific weight loss instructions and strategies. Tracy Arnold will slowly increase activity as tolerated.  Repetitive spaced learning was employed today to elicit superior memory formation and behavioral change.  3. Class 1 obesity with serious comorbidity and body mass index (BMI) of 30.0 to 30.9 in adult, unspecified obesity type Tracy Arnold is currently in the action stage of change. As such, her goal is to continue with weight loss efforts. She has agreed to the Vegetarian Plan.   Exercise goals: For substantial health benefits, adults should do at least 150 minutes (2 hours and  30 minutes) a week of moderate-intensity, or 75 minutes (1 hour and 15 minutes) a week of vigorous-intensity aerobic physical activity, or an equivalent combination of moderate- and vigorous-intensity aerobic activity. Aerobic activity should be performed in episodes of at least 10 minutes, and preferably, it should be spread throughout the week.  Behavioral modification strategies: increasing lean protein intake and increasing water intake.  Tracy Arnold has agreed to follow-up with our clinic in 3-4 weeks. She was informed of the importance of frequent follow-up visits to maximize her success with intensive lifestyle modifications for her multiple health conditions.   Objective:   Blood pressure 122/82, pulse (!) 101, temperature 98 F (36.7 C), temperature source Oral, height 5\' 3"  (1.6 m), weight 171 lb (77.6 kg), last menstrual period 03/28/2020, SpO2 99 %. Body mass index is 30.29 kg/m.  General: Cooperative, alert, well developed, in no acute distress. HEENT: Conjunctivae and lids unremarkable. Cardiovascular: Regular rhythm.  Lungs: Normal work of breathing. Neurologic: No focal deficits.   Lab Results  Component Value Date   CREATININE 0.75 02/03/2020   BUN 12 02/03/2020   NA 139 02/03/2020   K 4.5 02/03/2020   CL 104 02/03/2020   CO2 21 02/03/2020   Lab Results  Component Value Date   ALT 17 02/03/2020   AST 24 02/03/2020   ALKPHOS 43 (L) 02/03/2020   BILITOT 0.3 02/03/2020   Lab Results  Component Value Date   HGBA1C 5.3 02/03/2020   HGBA1C 5.2 07/23/2019   Lab Results  Component Value Date   INSULIN 20.0 02/03/2020  Lab Results  Component Value Date   TSH 4.90 (H) 03/29/2020   Lab Results  Component Value Date   CHOL 184 02/03/2020   HDL 67 02/03/2020   LDLCALC 98 02/03/2020   TRIG 110 02/03/2020   CHOLHDL 2.7 02/03/2020   Lab Results  Component Value Date   WBC 4.9 02/03/2020   HGB 12.8 02/03/2020   HCT 40.1 02/03/2020   MCV 89 02/03/2020   PLT 242  02/03/2020   Lab Results  Component Value Date   IRON 72 02/03/2020   TIBC 399 02/03/2020   FERRITIN 20 02/03/2020   Attestation Statements:   Reviewed by clinician on day of visit: allergies, medications, problem list, medical history, surgical history, family history, social history, and previous encounter notes.  I, Insurance claims handler, CMA, am acting as transcriptionist for Helane Rima, DO  I have reviewed the above documentation for accuracy and completeness, and I agree with the above. Helane Rima, DO

## 2020-03-31 NOTE — Telephone Encounter (Signed)
Opened to review. Meds before sending lab result note.

## 2020-04-02 ENCOUNTER — Telehealth: Payer: Self-pay | Admitting: Medical

## 2020-04-02 ENCOUNTER — Encounter: Payer: Self-pay | Admitting: Medical

## 2020-04-02 MED ORDER — LEVOTHYROXINE SODIUM 25 MCG PO TABS: ORAL_TABLET | ORAL | 1 refills | Status: DC

## 2020-04-02 NOTE — Telephone Encounter (Signed)
Rx levothyroxine sent to pt pharmacy. 

## 2020-04-05 ENCOUNTER — Telehealth (INDEPENDENT_AMBULATORY_CARE_PROVIDER_SITE_OTHER): Payer: Self-pay | Admitting: Psychology

## 2020-04-05 ENCOUNTER — Other Ambulatory Visit: Payer: Self-pay

## 2020-04-05 ENCOUNTER — Telehealth (INDEPENDENT_AMBULATORY_CARE_PROVIDER_SITE_OTHER): Payer: 59 | Admitting: Psychology

## 2020-04-05 NOTE — Telephone Encounter (Signed)
  Office: [phone]  /  Fax: [phone]  Date of Encounter: April 05, 2020  Time of Encounter: 10:00am Duration of Encounter: 3 minutes Provider: Lawerance Cruel, PsyD  CONTENT: Marielouise presented on time for today's follow-up appointment via MyChart Video Visit; however, she was driving. She was receptive to pulling over to a safe location to reschedule today's appointment. She acknowledged understanding that the one time no show fee waiver would be applied. No evidence or endorsement of safety concerns.  PLAN: Tennelle is scheduled for an appointment on April 21, 2020 at 8:00am via MyChart Video Visit.

## 2020-04-06 ENCOUNTER — Other Ambulatory Visit: Payer: Self-pay | Admitting: Medical

## 2020-04-06 MED FILL — METOPROLOL SUCCINATE ER 50: 50 | 90 days supply | Qty: 90 | Fill #0

## 2020-04-06 MED FILL — HYDROCHLOROTHIAZIDE 12.5 MG: 12.5 | 30 days supply | Qty: 30 | Fill #1

## 2020-04-07 NOTE — Progress Notes (Unsigned)
Office: [phone]  /  Fax: [phone]    Date: April 21, 2020   Appointment Start Time: *** Duration: *** minutes Provider: Lawerance Cruel, Psy.D. Type of Session: Individual Therapy  Location of Patient: {gbptloc:23249} Location of Provider: {Location of Service:22491} Type of Contact: Telepsychological Visit via MyChart Video Visit  Session Content: Tracy Arnold is a 34 y.o. female presenting via MyChart Video Visit for a follow-up appointment to address the previously established treatment goal of increasing coping skills. Today's appointment was a telepsychological visit due to COVID-19. [name] provided verbal consent for today's telepsychological appointment and she is aware she is responsible for securing confidentiality on her end of the session. Prior to proceeding with today's appointment, [name]'s physical location at the time of this appointment was obtained as well a phone number she could be reached at in the event of technical difficulties. [name] and this provider participated in today's telepsychological service.   This provider conducted a brief check-in and verbally administered the PHQ-9 and GAD-7. *** [name] was receptive to today's appointment as evidenced by openness to sharing, responsiveness to feedback, and {gbreceptiveness:23401}.  Mental Status Examination:  Appearance: {Appearance:22431} Behavior: {Behavior:22445} Mood: {gbmood:21757} Affect: {Affect:22436} Speech: {Speech:22432} Eye Contact: {Eye Contact:22433} Psychomotor Activity: {Motor Activity:22434} Gait: {gbgait:23404} Thought Process: {thought process:22448}  Thought Content/Perception: {disturbances:22451} Orientation: {Orientation:22437} Memory/Concentration: {gbcognition:22449} Insight/Judgment: {Insight:22446}  Structured Assessments Results: The Patient Health Questionnaire-9 (PHQ-9) is a self-report measure that assesses symptoms and severity of depression over the course of the last two weeks. [name]  obtained a score of *** suggesting {GBPHQ9SEVERITY:21752}. [name] finds the endorsed symptoms to be {gbphq9difficulty:21754}. [0= Not at all; 1= Several days; 2= More than half the days; 3= Nearly every day] Little interest or pleasure in doing things ***  Feeling down, depressed, or hopeless ***  Trouble falling or staying asleep, or sleeping too much ***  Feeling tired or having little energy ***  Poor appetite or overeating ***  Feeling bad about yourself --- or that you are a failure or have let yourself or your family down ***  Trouble concentrating on things, such as reading the newspaper or watching television ***  Moving or speaking so slowly that other people could have noticed? Or the opposite --- being so fidgety or restless that you have been moving around a lot more than usual ***  Thoughts that you would be better off dead or hurting yourself in some way ***  PHQ-9 Score ***    The Generalized Anxiety Disorder-7 (GAD-7) is a brief self-report measure that assesses symptoms of anxiety over the course of the last two weeks. [name] obtained a score of *** suggesting {gbgad7severity:21753}. [name] finds the endorsed symptoms to be {gbphq9difficulty:21754}. [0= Not at all; 1= Several days; 2= Over half the days; 3= Nearly every day] Feeling nervous, anxious, on edge ***  Not being able to stop or control worrying ***  Worrying too much about different things ***  Trouble relaxing ***  Being so restless that it's hard to sit still ***  Becoming easily annoyed or irritable ***  Feeling afraid as if something awful might happen ***  GAD-7 Score ***   Interventions:  {Interventions for Progress Notes:23405}  DSM-5 Diagnosis(es): 307.59 (F50.8) Other Specified Feeding or Eating Disorder, Emotional Eating Behaviors  Treatment Goal & Progress: During the initial appointment with this provider, the following treatment goal was established: increase coping skills. [name] has demonstrated  progress in her goal as evidenced by {gbtxprogress:22839}. [name] also {gbtxprogress2:22951}.  Plan: The next appointment will  be scheduled in {gbweeks:21758}, which will be {gbtxmodality:23402}. The next session will focus on {Plan for Next Appointment:23400}.

## 2020-04-13 MED FILL — MELOXICAM 15 MG TABLET: 15 | 30 days supply | Qty: 30 | Fill #0

## 2020-04-19 ENCOUNTER — Ambulatory Visit: Payer: 59 | Admitting: Family Medicine

## 2020-04-21 ENCOUNTER — Telehealth (INDEPENDENT_AMBULATORY_CARE_PROVIDER_SITE_OTHER): Payer: 59 | Admitting: Psychology

## 2020-04-26 ENCOUNTER — Other Ambulatory Visit: Payer: Self-pay

## 2020-04-26 ENCOUNTER — Encounter: Payer: Self-pay | Admitting: Family Medicine

## 2020-04-26 ENCOUNTER — Ambulatory Visit (INDEPENDENT_AMBULATORY_CARE_PROVIDER_SITE_OTHER): Payer: Self-pay | Admitting: Family Medicine

## 2020-04-26 VITALS — BP 145/90 | HR 90 | Ht 68.0 in | Wt 171.0 lb

## 2020-04-26 DIAGNOSIS — N920 Excessive and frequent menstruation with regular cycle: Secondary | ICD-10-CM

## 2020-04-26 DIAGNOSIS — E039 Hypothyroidism, unspecified: Secondary | ICD-10-CM | POA: Insufficient documentation

## 2020-04-26 DIAGNOSIS — I10 Essential (primary) hypertension: Secondary | ICD-10-CM | POA: Insufficient documentation

## 2020-04-26 MED ORDER — NORETHIN ACE-ETH ESTRAD-FE 1-20 MG-MCG(24) PO TABS: 1.0000 | ORAL_TABLET | Freq: Every day | ORAL | 3 refills | Status: DC

## 2020-04-26 MED FILL — tiZANidine HCL 4 MG TABS: 4 | 30 days supply | Qty: 60 | Fill #1

## 2020-04-26 MED FILL — AMLODIPINE BESYLATE 5 MG TA: 5 | 30 days supply | Qty: 30 | Fill #1

## 2020-04-26 MED FILL — BLISOVI 24 FE 1-20 MG-MCG(2: 1-20 | 28 days supply | Qty: 28 | Fill #0

## 2020-04-26 NOTE — Progress Notes (Signed)
Patient has irregular periods. Patient wanted to have annual done today but started her period this morning. Patient just got married and would like to discuss birth control options.  LMP:Jan- March- regular peroid, June- no peroid, July 14-18, August 18 Tracy Stammer RN

## 2020-04-26 NOTE — Progress Notes (Signed)
   Subjective:    Patient ID: Tracy Arnold, female    DOB: May 26, 1986, 34 y.o.   MRN: [id number]  HPI Patient seen for heavy menses. Having some irregular bleeding - sometimes 4-5 days early or late over the past 4 months. Also, would like start OCPs. She does have premenstrual headaches. No history of DVT, no family history of DVT.  Has hypothyroidism, which was recently diagnosed. Still not therapeutic. No recent weight gain. Has actually been working at weight loss.  I have reviewed the patients past medical, family, and social history.  I have reviewed the patient's medication list and allergies.   Review of Systems     Objective:   Physical Exam Vitals reviewed.  Constitutional:      Appearance: Normal appearance.  HENT:     Head: Normocephalic and atraumatic.  Cardiovascular:     Rate and Rhythm: Normal rate and regular rhythm.     Pulses: Normal pulses.  Pulmonary:     Effort: Pulmonary effort is normal.     Breath sounds: Normal breath sounds.  Skin:    Capillary Refill: Capillary refill takes less than 2 seconds.  Neurological:     Mental Status: She is alert.       Assessment & Plan:  1. Menorrhagia with regular cycle Start loestrin. This should help to regulate cycles a little more. Her hypothyroidism is probably playing a part in her slightly irregular cycle. Would recommended goal TSH 2.0-2.5.   2. Hypothyroidism, unspecified type On synthroid  3. Essential hypertension

## 2020-05-14 ENCOUNTER — Other Ambulatory Visit: Payer: Self-pay | Admitting: Medical

## 2020-05-14 MED FILL — HYDROCHLOROTHIAZIDE 12.5 MG: 12.5 | 90 days supply | Qty: 90 | Fill #0

## 2020-05-20 ENCOUNTER — Ambulatory Visit: Payer: Self-pay | Admitting: Family Medicine

## 2020-06-19 ENCOUNTER — Encounter: Payer: Self-pay | Admitting: Medical

## 2020-06-21 MED ORDER — AMLODIPINE BESYLATE 5 MG PO TABS
5.0000 mg | ORAL_TABLET | Freq: Every day | ORAL | 3 refills | Status: DC
Start: 2020-06-21 — End: 2020-06-23

## 2020-06-23 ENCOUNTER — Telehealth: Payer: Self-pay | Admitting: Medical

## 2020-06-23 DIAGNOSIS — R519 Headache, unspecified: Secondary | ICD-10-CM

## 2020-06-23 DIAGNOSIS — E039 Hypothyroidism, unspecified: Secondary | ICD-10-CM

## 2020-06-23 DIAGNOSIS — I10 Essential (primary) hypertension: Secondary | ICD-10-CM

## 2020-06-23 DIAGNOSIS — R5383 Other fatigue: Secondary | ICD-10-CM

## 2020-06-23 MED ORDER — AMLODIPINE BESYLATE 5 MG PO TABS: 5.0000 mg | ORAL_TABLET | Freq: Every day | ORAL | 3 refills | Status: AC

## 2020-06-23 NOTE — Telephone Encounter (Signed)
Future labs placed per pt request.

## 2020-06-23 NOTE — Telephone Encounter (Signed)
Amlodipine rx sent to pt pharmacy.

## 2020-06-24 ENCOUNTER — Other Ambulatory Visit (HOSPITAL_COMMUNITY)
Admission: RE | Admit: 2020-06-24 | Discharge: 2020-06-24 | Disposition: A | Discharge status: Home / Self Care | Payer: Managed Care, Other (non HMO) | Source: Ambulatory Visit | Attending: Family Medicine | Admitting: Family Medicine

## 2020-06-24 ENCOUNTER — Encounter: Payer: Self-pay | Admitting: Medical

## 2020-06-24 ENCOUNTER — Encounter: Payer: Self-pay | Admitting: Family Medicine

## 2020-06-24 ENCOUNTER — Telehealth: Payer: Self-pay | Admitting: Medical

## 2020-06-24 ENCOUNTER — Ambulatory Visit (INDEPENDENT_AMBULATORY_CARE_PROVIDER_SITE_OTHER): Payer: Managed Care, Other (non HMO) | Admitting: Family Medicine

## 2020-06-24 ENCOUNTER — Other Ambulatory Visit (HOSPITAL_BASED_OUTPATIENT_CLINIC_OR_DEPARTMENT_OTHER): Payer: Self-pay | Admitting: Medical

## 2020-06-24 ENCOUNTER — Ambulatory Visit (INDEPENDENT_AMBULATORY_CARE_PROVIDER_SITE_OTHER): Payer: Managed Care, Other (non HMO) | Admitting: Medical

## 2020-06-24 ENCOUNTER — Other Ambulatory Visit: Payer: Self-pay

## 2020-06-24 VITALS — BP 121/78 | HR 86 | Wt 167.0 lb

## 2020-06-24 VITALS — BP 130/85 | HR 88 | Temp 98.8°F | Resp 18 | Ht 62.0 in | Wt 166.0 lb

## 2020-06-24 DIAGNOSIS — G8929 Other chronic pain: Secondary | ICD-10-CM

## 2020-06-24 DIAGNOSIS — R202 Paresthesia of skin: Secondary | ICD-10-CM

## 2020-06-24 DIAGNOSIS — M79601 Pain in right arm: Secondary | ICD-10-CM

## 2020-06-24 DIAGNOSIS — I10 Essential (primary) hypertension: Secondary | ICD-10-CM | POA: Diagnosis not present

## 2020-06-24 DIAGNOSIS — R519 Headache, unspecified: Secondary | ICD-10-CM

## 2020-06-24 DIAGNOSIS — M79602 Pain in left arm: Secondary | ICD-10-CM

## 2020-06-24 DIAGNOSIS — E039 Hypothyroidism, unspecified: Secondary | ICD-10-CM

## 2020-06-24 DIAGNOSIS — R5383 Other fatigue: Secondary | ICD-10-CM

## 2020-06-24 DIAGNOSIS — Z01419 Encounter for gynecological examination (general) (routine) without abnormal findings: Secondary | ICD-10-CM

## 2020-06-24 MED ORDER — PREDNISONE 10 MG (21) PO TBPK: ORAL_TABLET | ORAL | 0 refills | Status: DC

## 2020-06-24 MED ORDER — KETOROLAC TROMETHAMINE 60 MG/2ML IM SOLN
60.0000 mg | Freq: Once | INTRAMUSCULAR | Status: AC
  Administered 2020-06-24: 60 mg via INTRAMUSCULAR

## 2020-06-24 MED ORDER — CYCLOBENZAPRINE HCL 5 MG PO TABS: ORAL_TABLET | ORAL | 0 refills | Status: DC

## 2020-06-24 MED FILL — CYCLOBENZAPRINE HCL 5 MG TA: 5 | 5 days supply | Qty: 5 | Fill #0

## 2020-06-24 MED FILL — predniSONE 10 MG TABS: 10 | 6 days supply | Qty: 21 | Fill #0

## 2020-06-24 MED FILL — FAMOTIDINE 20 MG TABS: 20 | 90 days supply | Qty: 90 | Fill #1

## 2020-06-24 NOTE — Progress Notes (Addendum)
Subjective:    Patient ID: Tracy Arnold, female    DOB: 09-12-1985, 34 y.o.   MRN: [id number]  HPI  Pt in for follow up.  Pt has htn. I refilled her amlodipine last night. Pt bp has been controlled 120/80 most of the time.   Pt lives in [place]. She lives 2[place]5 hours away. Pcp delay.  Pt has been getting ha's that started about 2 months ago. HA started around time of starting ocp. She had HA past 10 days presently daily basis and has not stopped. Present to some degree daily basis. She had some ha that were light sensitive. No hx of CT imaging. One day had t tingle(pareshtesia) to both arms with ha(near syncope that day late in  late September). Pt states no sound sensitivity. No visual aura.   Does not changing position such as bending over and lying down can increase ha.  Last night had some posterior neck pain  Pt thinks possible side effect from hormones.   The headache is delaying her sleep. Can't exercise early in morning since waking late.   A/P FP MD  in past  Migraine headache diet/triggers reviewed with the patient and information given. Restart the Toprol-XL 50 mg once a day to improve blood pressure palpitations and migraines. Tussionex 1 teaspoon twice daily or nightly as needed cough return in 2 weeks for follow-up or sooner if necessary. Recommended healthy diet i.e. low-sodium. Recommend consistent exercise when she is feeling better. Patient has out of work note through this week. Discussed the prescription noted above, including potential side effects, drug interactions, instructions for taking the medication, and the consequences of not taking it. Patient verbalized an understanding of these instructions and had no further questions.   Pt states ha interuppting her ha causing some depression since disrupting life style.   Addendum to note at 5:46 PM on 06/25/2020. I had a discussion with patient earlier today regarding her sed rate elevation and differential diagnosis  of recent constant headaches. 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 Jerrell Belfast.She talked with him  after I talked with her.  She states Dr. Jerrell Belfast thought she should get MRI with and without contrast, MRA and MRV asap. Patient is scheduled to  see Dr. Hazle Quant optometrist on Monday morning. We will see if papilledema is seen. Then might try to call Dr. Jerrell Belfast to consult directly by phone as her insurance would need prior authorization for varied studies. Too late in the day to attempt pior authorization past 5 pm. Also think knowing if papilledema present important component. Pt will continue prednisone. If she has  signs and symptoms change or worsen then advise to be in the ED for work up and imaging studies.  Review of Systems  Constitutional: Positive for fatigue. Negative for chills and fever.  HENT: Negative for congestion.   Respiratory: Negative for cough, chest tightness, shortness of breath and wheezing.   Cardiovascular: Negative for chest pain and palpitations.  Gastrointestinal: Negative for abdominal pain.  Genitourinary: Negative for dysuria.  Musculoskeletal: Negative for back pain and myalgias.  Skin: Negative for rash.  Neurological: Positive for headaches. Negative for dizziness, syncope, speech difficulty and weakness.  Hematological: Negative for adenopathy.  Psychiatric/Behavioral: Positive for dysphoric mood and sleep disturbance. Negative for behavioral problems and suicidal ideas. The patient  is not nervous/anxious and is not hyperactive.    Past Medical History:  Diagnosis Date  . Edema, lower extremity   . GERD (gastroesophageal reflux disease)   . Hypertension    borderline in past.  . Hypothyroid   . Labral tear of left hip joint      Social History   Socioeconomic  History  . Marital status: Married    Spouse name: Not on file  . Number of children: 0  . Years of education: Not on file  . Highest education level: Not on file  Occupational History  . Occupation: Charity fundraiser  Tobacco Use  . Smoking status: Never Smoker  . Smokeless tobacco: Never Used  Vaping Use  . Vaping Use: Never used  Substance and Sexual Activity  . Alcohol use: Never  . Drug use: Never  . Sexual activity: Not Currently    Birth control/protection: None  Other Topics Concern  . Not on file  Social History Narrative  . Not on file   Social Determinants of Health   Financial Resource Strain:   . Difficulty of Paying Living Expenses: Not on file  Food Insecurity:   . Worried About Programme researcher, broadcasting/film/video in the Last Year: Not on file  . Ran Out of Food in the Last Year: Not on file  Transportation Needs:   . Lack of Transportation (Medical): Not on file  . Lack of Transportation (Non-Medical): Not on file  Physical Activity:   . Days of Exercise per Week: Not on file  . Minutes of Exercise per Session: Not on file  Stress:   . Feeling of Stress : Not on file  Social Connections:   . Frequency of Communication with Friends and Family: Not on file  . Frequency of Social Gatherings with Friends and Family: Not on file  . Attends Religious Services: Not on file  . Active Member of Clubs or Organizations: Not on file  . Attends Banker Meetings: Not on file  . Marital Status: Not on file  Intimate Partner Violence:   . Fear of Current or Ex-Partner: Not on file  . Emotionally Abused: Not on file  . Physically Abused: Not on file  . Sexually Abused: Not on file    No past surgical history on file.  Family History  Problem Relation Age of Onset  . Thyroid disease Mother   . Diabetes Father   . Hypertension Father   . Stroke Father     No Known Allergies  Current Outpatient Medications on File Prior to Visit  Medication Sig Dispense Refill  .  amLODipine (NORVASC) 5 MG tablet Take 1 tablet (5 mg total) by mouth daily. 30 tablet 3  . famotidine (PEPCID) 20 MG tablet Take 1 tablet (20 mg total) by mouth daily. 30 tablet 0  . hydrochlorothiazide (MICROZIDE) 12.5 MG capsule Take 1 capsule (12.5 mg total) by mouth daily. 90 capsule 1  . levothyroxine (SYNTHROID) 25 MCG tablet 1.5 tab po daily 45 tablet 1  . meloxicam (MOBIC) 15 MG tablet Take 15 mg by mouth daily.     . metoprolol succinate (TOPROL-XL) 50 MG 24 hr tablet TAKE 1 TABLET (50 MG TOTAL) BY MOUTH DAILY TAKE WITH OR IMMEDIATELY FOLLOWING A MEAL 90 tablet 1  . Norethindrone Acetate-Ethinyl Estrad-FE (LOESTRIN 24 FE) 1-20 MG-MCG(24) tablet Take 1 tablet by mouth daily. 84 tablet 3  . tiZANidine (ZANAFLEX) 4 MG tablet Take 4 mg by mouth 2 (two) times daily as needed.    Marland Kitchen  Vitamin D, Ergocalciferol, (DRISDOL) 1.25 MG (50000 UNIT) CAPS capsule Take 1 capsule (50,000 Units total) by mouth every 7 (seven) days. 4 capsule 0   No current facility-administered medications on file prior to visit.    BP 130/85   Pulse 88   Temp 98.8 F (37.1 C) (Oral)   Resp 18   Ht 5\' 2"  (1.575 m)   Wt 166 lb (75.3 kg)   SpO2 100%   BMI 30.36 kg/m      Objective:   Physical Exam  General Mental Status- Alert. General Appearance- Not in acute distress.   Skin General: Color- Normal Color. Moisture- Normal Moisture.  Neck Carotid Arteries- Normal color. Moisture- Normal Moisture. No carotid bruits. No JVD. Trapezius tender to palpation/posterior neck.  Chest and Lung Exam Auscultation: Breath Sounds:-Normal.  Cardiovascular Auscultation:Rythm- Regular. Murmurs & Other Heart Sounds:Auscultation of the heart reveals- No Murmurs.  Abdomen Inspection:-Inspeection Normal. Palpation/Percussion:Note:No mass. Palpation and Percussion of the abdomen reveal- Non Tender, Non Distended + BS, no rebound or guarding.    Neurologic Cranial Nerve exam:- CN III-XII intact(No nystagmus),  symmetric smile. Drift Test:- No drift. Finger to Nose:- Normal/Intact Strength:- 5/5 equal and symmetric strength both upper and lower extremities.      Assessment & Plan:  You have history of headaches over the years but increased frequency and longer duration than normal most recently. Current headache is daily basically present for a full 10 days of yesterday morning pain has subsided a great deal. Recent new features of paresthesia and near syncope end of September when had moderate to severe headache at that time. I went ahead and place CT of head without contrast ordered. We need to get prior authorization. Sent message to staff. Try to get authorization and then get you scheduled for tomorrow.  We gave you Toradol 30 mg IM injection today and I want you to use Flexeril 5 mg tonight. Hopefully your headache will subside.  History of low thyroid and fatigue. Will check TSH, T4, CBC, CMP, B12, B1, B12 and vitamin D. Note some fatigue might simply be from not sleeping well due to the headache.  You get opinion from gynecologist today whether or not oral contraceptives playing a role in current headaches.  Some decreased mood/depression recently but sounds like it is related to moderate to severe headache disrupting lifestyle.  Follow-up date to be determined after lab and imaging review.  Happy to see you pending you getting established with local PCP in October since it sounds like that is going to take some time.   Time spent with patient today was 40+  minutes which consisted of chart review, discussing diagnosis, work up, treatment and documentation.

## 2020-06-24 NOTE — Progress Notes (Signed)
GYNECOLOGY ANNUAL PREVENTATIVE CARE ENCOUNTER NOTE  Subjective:   Tracy Arnold is a 34 y.o. G0P0000 female here for a routine annual gynecologic exam.  Current complaints: persistent HA over the past 10 days. Had some headaches prior to starting COCs, but headaches worsened over past couple of months. She stopped the COC 7 days ago, but still having HA.   Denies abnormal vaginal bleeding, discharge, pelvic pain, problems with intercourse or other gynecologic concerns.    Gynecologic History No LMP recorded. (Menstrual status: Oral contraceptives). Patient is sexually active  Contraception: condoms Last Pap: 2018. Results were: normal Last mammogram: n/a  Obstetric History OB History  Gravida Para Term Preterm AB Living  0 0 0 0 0 0  SAB TAB Ectopic Multiple Live Births  0 0 0 0 0    Past Medical History:  Diagnosis Date  . Edema, lower extremity   . GERD (gastroesophageal reflux disease)   . Hypertension    borderline in past.  . Hypothyroid   . Labral tear of left hip joint     No past surgical history on file.  Current Outpatient Medications on File Prior to Visit  Medication Sig Dispense Refill  . amLODipine (NORVASC) 5 MG tablet Take 1 tablet (5 mg total) by mouth daily. 30 tablet 3  . famotidine (PEPCID) 20 MG tablet Take 1 tablet (20 mg total) by mouth daily. 30 tablet 0  . hydrochlorothiazide (MICROZIDE) 12.5 MG capsule Take 1 capsule (12.5 mg total) by mouth daily. 90 capsule 1  . levothyroxine (SYNTHROID) 25 MCG tablet 1.5 tab po daily 45 tablet 1  . meloxicam (MOBIC) 15 MG tablet Take 15 mg by mouth daily.     . metoprolol succinate (TOPROL-XL) 50 MG 24 hr tablet TAKE 1 TABLET (50 MG TOTAL) BY MOUTH DAILY TAKE WITH OR IMMEDIATELY FOLLOWING A MEAL 90 tablet 1  . tiZANidine (ZANAFLEX) 4 MG tablet Take 4 mg by mouth 2 (two) times daily as needed.    . Vitamin D, Ergocalciferol, (DRISDOL) 1.25 MG (50000 UNIT) CAPS capsule Take 1 capsule (50,000 Units total) by  mouth every 7 (seven) days. 4 capsule 0  . Norethindrone Acetate-Ethinyl Estrad-FE (LOESTRIN 24 FE) 1-20 MG-MCG(24) tablet Take 1 tablet by mouth daily. (Patient not taking: Reported on 06/24/2020) 84 tablet 3   No current facility-administered medications on file prior to visit.    No Known Allergies  Social History   Socioeconomic History  . Marital status: Married    Spouse name: Not on file  . Number of children: 0  . Years of education: Not on file  . Highest education level: Not on file  Occupational History  . Occupation: Charity fundraiser  Tobacco Use  . Smoking status: Never Smoker  . Smokeless tobacco: Never Used  Vaping Use  . Vaping Use: Never used  Substance and Sexual Activity  . Alcohol use: Never  . Drug use: Never  . Sexual activity: Not Currently    Birth control/protection: None  Other Topics Concern  . Not on file  Social History Narrative  . Not on file   Social Determinants of Health   Financial Resource Strain:   . Difficulty of Paying Living Expenses: Not on file  Food Insecurity:   . Worried About Programme researcher, broadcasting/film/video in the Last Year: Not on file  . Ran Out of Food in the Last Year: Not on file  Transportation Needs:   . Lack of Transportation (Medical): Not on file  .  Lack of Transportation (Non-Medical): Not on file  Physical Activity:   . Days of Exercise per Week: Not on file  . Minutes of Exercise per Session: Not on file  Stress:   . Feeling of Stress : Not on file  Social Connections:   . Frequency of Communication with Friends and Family: Not on file  . Frequency of Social Gatherings with Friends and Family: Not on file  . Attends Religious Services: Not on file  . Active Member of Clubs or Organizations: Not on file  . Attends Banker Meetings: Not on file  . Marital Status: Not on file  Intimate Partner Violence:   . Fear of Current or Ex-Partner: Not on file  . Emotionally Abused: Not on file  . Physically Abused: Not on file   . Sexually Abused: Not on file    Family History  Problem Relation Age of Onset  . Thyroid disease Mother   . Diabetes Father   . Hypertension Father   . Stroke Father     The following portions of the patient's history were reviewed and updated as appropriate: allergies, current medications, past family history, past medical history, past social history, past surgical history and problem list.  Review of Systems Pertinent items are noted in HPI.   Objective:  BP 121/78   Pulse 86   Wt 167 lb (75.8 kg)   BMI 30.54 kg/m  Wt Readings from Last 3 Encounters:  06/24/20 167 lb (75.8 kg)  06/24/20 166 lb (75.3 kg)  04/26/20 171 lb (77.6 kg)     Chaperone present during exam  CONSTITUTIONAL: Well-developed, well-nourished female in no acute distress.  HENT:  Normocephalic, atraumatic, External right and left ear normal. Oropharynx is clear and moist EYES: Conjunctivae and EOM are normal. Pupils are equal, round, and reactive to light. No scleral icterus.  NECK: Normal range of motion, supple, no masses.  Normal thyroid.   CARDIOVASCULAR: Normal heart rate noted, regular rhythm RESPIRATORY: Clear to auscultation bilaterally. Effort and breath sounds normal, no problems with respiration noted. BREASTS: Symmetric in size. No masses, skin changes, nipple drainage, or lymphadenopathy. ABDOMEN: Soft, normal bowel sounds, no distention noted.  No tenderness, rebound or guarding.  PELVIC: Normal appearing external genitalia; normal appearing vaginal mucosa and cervix.  No abnormal discharge noted.   MUSCULOSKELETAL: Normal range of motion. No tenderness.  No cyanosis, clubbing, or edema.  2+ distal pulses. SKIN: Skin is warm and dry. No rash noted. Not diaphoretic. No erythema. No pallor. NEUROLOGIC: Alert and oriented to person, place, and time. Normal reflexes, muscle tone coordination. No cranial nerve deficit noted. PSYCHIATRIC: Normal mood and affect. Normal behavior. Normal  judgment and thought content.  Assessment:  Annual gynecologic examination with pap smear   Plan:  1. Well Woman Exam Will follow up results of pap smear and manage accordingly.  2. Acute intractable headache, unspecified headache type Possibly secondary to COCs, although I would expect for the headaches to have resolve after stopping the COCs. She has seen her PCP, who has ordered a CT Head and labs. Will follow for now. May trial either low dose estrogen containing COC or progesterone only pill.   3. Primary hypertension controlled  4. Hypothyroidism, unspecified type TSH per PCP.   Routine preventative health maintenance measures emphasized. Please refer to After Visit Summary for other counseling recommendations.    Candelaria Celeste, DO [facility]

## 2020-06-24 NOTE — Patient Instructions (Addendum)
You have history of headaches over the years but increased frequency and longer duration than normal most recently. Current headache is daily basically present for a full 10 days of yesterday morning pain has subsided a great deal. Recent new features of paresthesia and near syncope end of September when had moderate to severe headache at that time. I went ahead and place CT of head without contrast ordered. We need to get prior authorization. Sent message to staff. Try to get authorization and then get you scheduled for tomorrow.  We gave you Toradol 30 mg IM injection today and I want you to use Flexeril 5 mg tonight. Hopefully your headache will subside.  History of low thyroid and fatigue. Will check TSH, T4, CBC, CMP, B12, B1, B12 and vitamin D. Note some fatigue might simply be from not sleeping well due to the headache.  You get opinion from gynecologist today whether or not oral contraceptives playing a role in current headaches.  Some decreased mood/depression recently but sounds like it is related to moderate to severe headache disrupting lifestyle.  Follow-up date to be determined after lab and imaging review.  Happy to see you pending you getting established with local PCP in [place] since it sounds like that is going to take some time.   Update on 5:43 pm. I did talk with Dr. Wilford Corner directly today.  Discussed pt clinical history, recent negative eye exam/no papilledema. Pt sed rate was elevated. HA despite being on predisone. He recommend MRI with and without contrast due to obvious different features of ha, duration and lack of response to medications. HA seems to be worse. Change of positions. He also states that MRV indicated due to onset of ha occurring as started ocp. Test to rule out dural venous thrombosis. Also recommended MRA to evaluate vasculitis vs reversible cerebral vasoconstriction syndrome.   Dr. Wilford Corner will be working on Friday. Dr. Amada Jupiter will be working  tomorrow. Pt aware she can present to the ED at [facility] to be worked up. Prior for all three studies may take hours, requie peer to peer and may not be authorized.  I just put in mri with and without contrast studies. Also put in MRA. In process of putting in MRV. Called pt and dound out she  went back to [place].  Will put imaging studies on hold. Pt will try to get scheduled with neurologis localt. She may give me name and number of clinic and then will put in new referral. She may contact new FP this week if she can get scheduled.

## 2020-06-24 NOTE — Addendum Note (Signed)
Addended by: Kathryne Hitch on: 06/24/2020 11:31 AM   Modules accepted: Orders

## 2020-06-24 NOTE — Telephone Encounter (Signed)
Taper prednisone sent to pt pharmacy.

## 2020-06-25 ENCOUNTER — Telehealth: Payer: Self-pay | Admitting: Medical

## 2020-06-25 ENCOUNTER — Ambulatory Visit (HOSPITAL_BASED_OUTPATIENT_CLINIC_OR_DEPARTMENT_OTHER)
Admission: RE | Admit: 2020-06-25 | Discharge: 2020-06-25 | Disposition: A | Discharge status: Home / Self Care | Payer: Managed Care, Other (non HMO) | Source: Ambulatory Visit | Attending: Medical | Admitting: Medical

## 2020-06-25 DIAGNOSIS — G8929 Other chronic pain: Secondary | ICD-10-CM | POA: Insufficient documentation

## 2020-06-25 DIAGNOSIS — M79601 Pain in right arm: Secondary | ICD-10-CM | POA: Diagnosis present

## 2020-06-25 DIAGNOSIS — M79602 Pain in left arm: Secondary | ICD-10-CM | POA: Diagnosis present

## 2020-06-25 DIAGNOSIS — R519 Headache, unspecified: Secondary | ICD-10-CM

## 2020-06-25 DIAGNOSIS — R202 Paresthesia of skin: Secondary | ICD-10-CM | POA: Diagnosis present

## 2020-06-25 NOTE — Telephone Encounter (Signed)
Opened to review 

## 2020-06-28 ENCOUNTER — Telehealth: Payer: Self-pay | Admitting: Medical

## 2020-06-28 DIAGNOSIS — R519 Headache, unspecified: Secondary | ICD-10-CM

## 2020-06-28 LAB — VITAMIN D 1,25 DIHYDROXY
Vitamin D 1, 25 (OH)2 Total: 50 pg/mL (ref 18–72)
Vitamin D2 1, 25 (OH)2: 14 pg/mL
Vitamin D3 1, 25 (OH)2: 36 pg/mL

## 2020-06-28 LAB — CBC WITH DIFFERENTIAL/PLATELET
Absolute Monocytes: 230 cells/uL (ref 200–950)
Basophils Absolute: 29 cells/uL (ref 0–200)
Basophils Relative: 0.7 %
Eosinophils Absolute: 62 cells/uL (ref 15–500)
Eosinophils Relative: 1.5 %
HCT: 40.2 % (ref 35.0–45.0)
Hemoglobin: 13.4 g/dL (ref 11.7–15.5)
Lymphs Abs: 1308 cells/uL (ref 850–3900)
MCH: 28.3 pg (ref 27.0–33.0)
MCHC: 33.3 g/dL (ref 32.0–36.0)
MCV: 85 fL (ref 80.0–100.0)
MPV: 11.8 fL (ref 7.5–12.5)
Monocytes Relative: 5.6 %
Neutro Abs: 2472 cells/uL (ref 1500–7800)
Neutrophils Relative %: 60.3 %
Platelets: 219 10*3/uL (ref 140–400)
RBC: 4.73 10*6/uL (ref 3.80–5.10)
RDW: 13.5 % (ref 11.0–15.0)
Total Lymphocyte: 31.9 %
WBC: 4.1 10*3/uL (ref 3.8–10.8)

## 2020-06-28 LAB — IRON: Iron: 81 ug/dL (ref 40–190)

## 2020-06-28 LAB — COMPLETE METABOLIC PANEL WITH GFR
AG Ratio: 1.7 (calc) (ref 1.0–2.5)
ALT: 14 U/L (ref 6–29)
AST: 15 U/L (ref 10–30)
Albumin: 4.5 g/dL (ref 3.6–5.1)
Alkaline phosphatase (APISO): 33 U/L (ref 31–125)
BUN: 14 mg/dL (ref 7–25)
CO2: 26 mmol/L (ref 20–32)
Calcium: 9.7 mg/dL (ref 8.6–10.2)
Chloride: 103 mmol/L (ref 98–110)
Creat: 0.86 mg/dL (ref 0.50–1.10)
GFR, Est African American: 103 mL/min/{1.73_m2} (ref >=60)
GFR, Est Non African American: 89 mL/min/{1.73_m2} (ref >=60)
Globulin: 2.6 g/dL (calc) (ref 1.9–3.7)
Glucose, Bld: 94 mg/dL (ref 65–99)
Potassium: 4.1 mmol/L (ref 3.5–5.3)
Sodium: 137 mmol/L (ref 135–146)
Total Bilirubin: 0.5 mg/dL (ref 0.2–1.2)
Total Protein: 7.1 g/dL (ref 6.1–8.1)

## 2020-06-28 LAB — T4, FREE: Free T4: 1.4 ng/dL (ref 0.8–1.8)

## 2020-06-28 LAB — TSH: TSH: 1.53 mIU/L

## 2020-06-28 LAB — VITAMIN B1: Vitamin B1 (Thiamine): 10 nmol/L (ref 8–30)

## 2020-06-28 LAB — SEDIMENTATION RATE: Sed Rate: 86 mm/h — ABNORMAL HIGH (ref 0–20)

## 2020-06-28 NOTE — Telephone Encounter (Signed)
Opened to review and place mri.

## 2020-06-28 NOTE — Addendum Note (Signed)
Addended by: Gwenevere Abbot on: 06/28/2020 06:23 PM   Modules accepted: Orders

## 2020-06-28 NOTE — Telephone Encounter (Signed)
Put mri  studies on hold presently. Will update you.

## 2020-06-28 NOTE — Telephone Encounter (Signed)
Hold off on trying to get [employer].

## 2020-06-28 NOTE — Telephone Encounter (Signed)
Opened to review 

## 2020-06-30 LAB — CYTOLOGY - PAP
Comment: NEGATIVE
Diagnosis: UNDETERMINED — AB
High risk HPV: NEGATIVE

## 2020-07-01 ENCOUNTER — Encounter

## 2020-07-02 ENCOUNTER — Encounter

## 2020-07-02 ENCOUNTER — Inpatient Hospital Stay: Admit: 2020-07-02 | Payer: Self-pay | Attending: Family

## 2020-07-02 DIAGNOSIS — R519 Headache, unspecified: Secondary | ICD-10-CM

## 2020-07-02 IMAGING — DX DG CHEST 2V
2 series · 2 of 2 positions shown · non-contrast
Comparison: November 01, 2017

CLINICAL DATA: Persistent dry cough

EXAM:
CHEST - 2 VIEW

[chest pa]
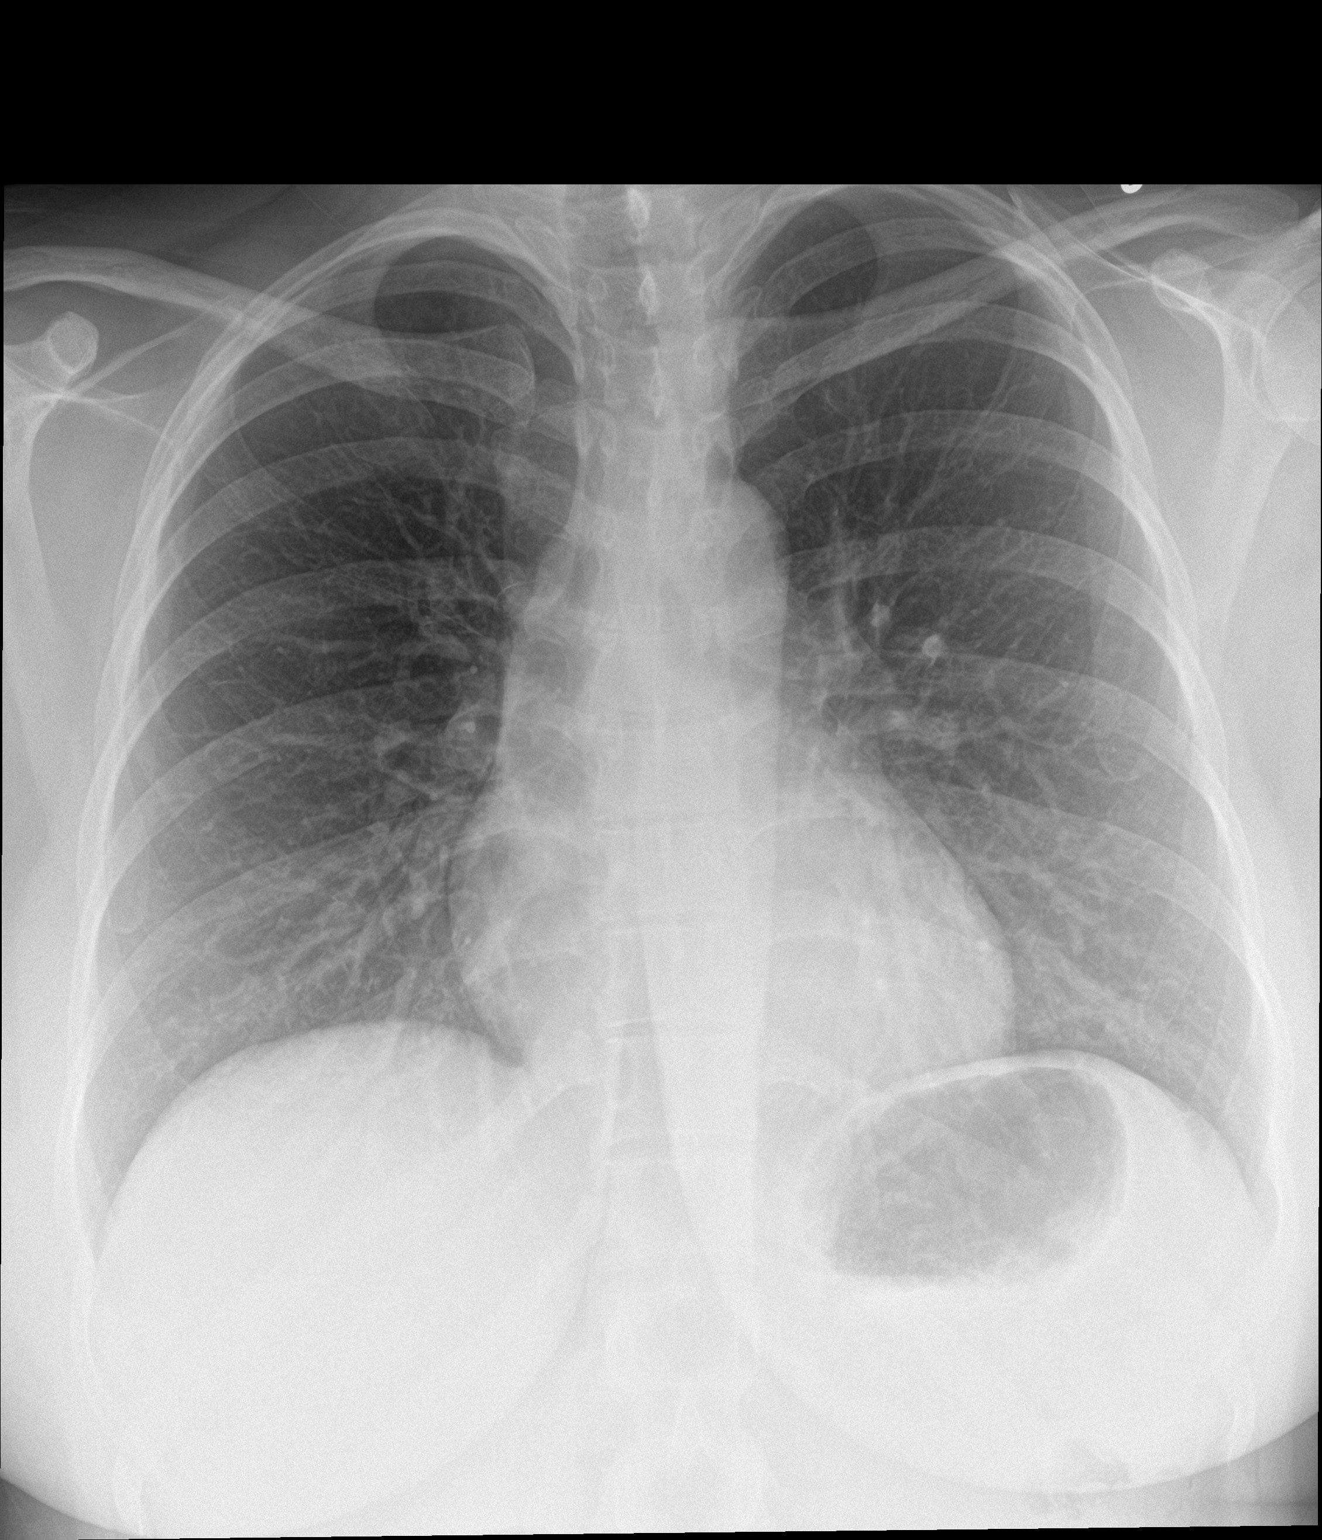

[chest lat]
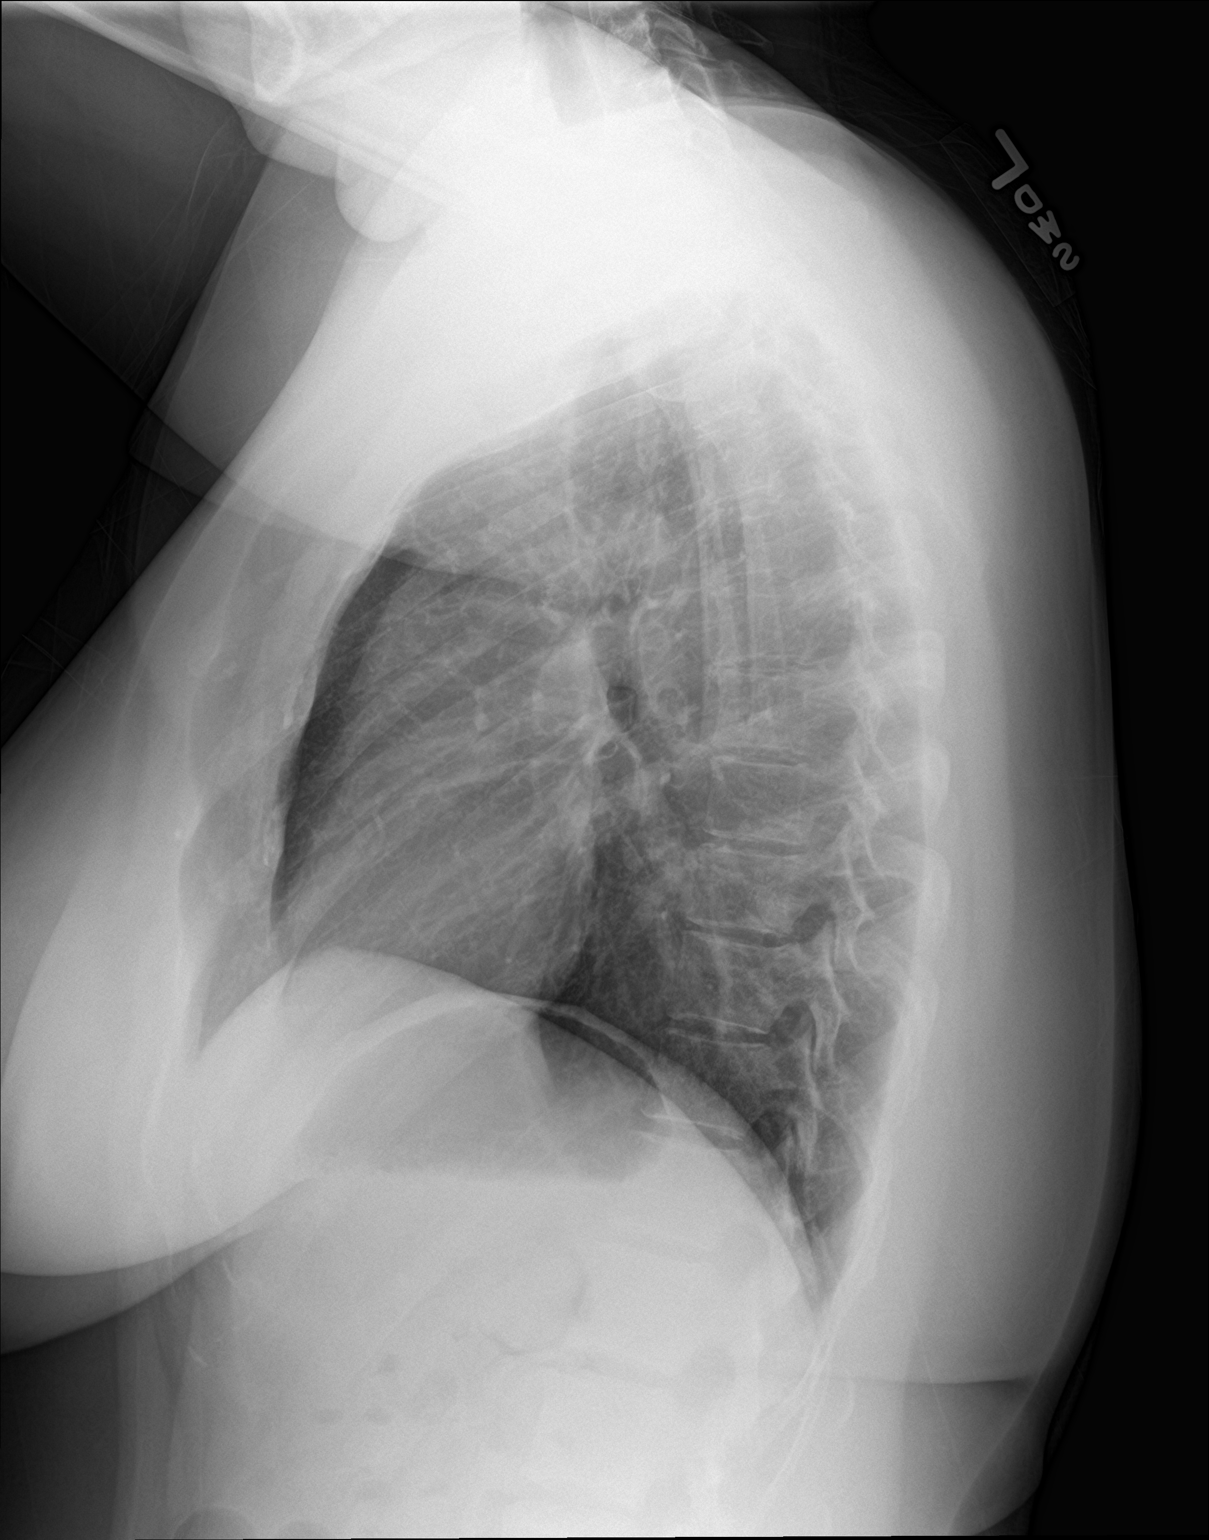

[2 of 2 positions shown; findings below may reference images not displayed]

FINDINGS: The heart size and mediastinal contours are within normal limits.
Both lungs are clear. The visualized skeletal structures are
unremarkable.
IMPRESSION: No active cardiopulmonary disease.

## 2020-07-04 ENCOUNTER — Telehealth: Payer: Self-pay | Admitting: Neurology

## 2020-07-04 ENCOUNTER — Encounter: Payer: Self-pay | Admitting: Medical

## 2020-07-04 NOTE — Telephone Encounter (Signed)
[place] and Welcome:This is an urgent referral from Dr. Wilford Corner neurohospitalist for headache. Let's see if we can make her a spot this week in the office. Thanks

## 2020-07-05 NOTE — Progress Notes (Signed)
See notr

## 2020-07-06 ENCOUNTER — Ambulatory Visit (INDEPENDENT_AMBULATORY_CARE_PROVIDER_SITE_OTHER): Payer: Managed Care, Other (non HMO) | Admitting: Neurology

## 2020-07-06 ENCOUNTER — Telehealth: Payer: Self-pay | Admitting: Neurology

## 2020-07-06 ENCOUNTER — Ambulatory Visit
Admission: RE | Admit: 2020-07-06 | Discharge: 2020-07-06 | Disposition: A | Discharge status: Home / Self Care | Payer: Managed Care, Other (non HMO) | Source: Ambulatory Visit | Attending: Neurology | Admitting: Neurology

## 2020-07-06 ENCOUNTER — Encounter: Payer: Self-pay | Admitting: Neurology

## 2020-07-06 ENCOUNTER — Other Ambulatory Visit: Payer: Self-pay

## 2020-07-06 VITALS — BP 130/93 | HR 95 | Ht 63.0 in | Wt 167.0 lb

## 2020-07-06 DIAGNOSIS — R202 Paresthesia of skin: Secondary | ICD-10-CM

## 2020-07-06 DIAGNOSIS — G43011 Migraine without aura, intractable, with status migrainosus: Secondary | ICD-10-CM

## 2020-07-06 DIAGNOSIS — R519 Headache, unspecified: Secondary | ICD-10-CM

## 2020-07-06 DIAGNOSIS — G08 Intracranial and intraspinal phlebitis and thrombophlebitis: Secondary | ICD-10-CM

## 2020-07-06 DIAGNOSIS — R29818 Other symptoms and signs involving the nervous system: Secondary | ICD-10-CM | POA: Diagnosis not present

## 2020-07-06 MED ORDER — IOPAMIDOL (ISOVUE-370) INJECTION 76%
100.0000 mL | Freq: Once | INTRAVENOUS | Status: AC | PRN
  Administered 2020-07-06: 100 mL via INTRAVENOUS

## 2020-07-06 NOTE — Telephone Encounter (Signed)
[employer]: NPR spoke to Lennart Pall Ref # [id number] patient is walking into GI and no auth thorough [employer].

## 2020-07-06 NOTE — Progress Notes (Signed)
[facility]    Provider:  Dr Lucia Gaskins Requesting Provider: Alvira Monday Kateri Mc, Dr. Wilford Corner  Primary Care Provider:  Esperanza Richters, PA-C  CC:  Severe intractable headache  HPI:  Tracy Arnold is a 34 y.o. female here as requested by Dr. Wilford Corner for migraines.  I reviewed primary care notes, patient lives in [place], 2 and half hours away, she has been getting headaches that started about 2 months prior to appointment June 24, 2020, she had headaches in the past however patient reported 10 days of recurrent headache that had not stopped, present to some degree on a daily basis, she had some headaches that were light sensitive, no history of CT imaging, 1 day had paresthesias to both arms with a headache and near syncope that day late in late September, no sound sensitivity, no visual aura, also posterior neck pain, possibly side effects from hormones, headache delaying her sleep.  I restarted metoprolol 50 mg.  Patient was out of work for a week.  Her sed rate was also slightly elevated.  She was sent to Dr. Hazle Quant optometrist for papilledema evaluation.  Also imaging was ordered.  Prednisone was started.  Worsening headaches the last 2 months. Worsening. She has a headache since the beginning of the month. Every day. She wakes up with it, in the morning hours it is dull and she can get on with her day, gets worse in the early afternoon. It has been severe. Light sensitivity, nausea, motion sensitivity, behind the eyes, pounding/pulating constant more behind her eyes than anything. Worse with position bending forward. Sed rate elevated 80s, improved on prednisone now sed rate is 32.She had a workup ANA panel. She has been off of her birth control for 2 weeks and no change. She saw ophthalmology and everything was normal.   Reviewed notes, labs and imaging from outside physicians, which showed:  CT 06/25/2020 showed No acute intracranial abnormalities including mass lesion or mass  effect, hydrocephalus, extra-axial fluid collection, midline shift, hemorrhage, or acute infarction, large ischemic events (personally reviewed images)  TSH normal 06/24/2010, cbc normal  MRI: reviewed report, normal.  Review of Systems: Patient complains of symptoms per HPI as well as the following symptoms: headache. Pertinent negatives and positives per HPI. All others negative.   Social History   Socioeconomic History  . Marital status: Married    Spouse name: Not on file  . Number of children: 0  . Years of education: Not on file  . Highest education level: Not on file  Occupational History  . Occupation: Charity fundraiser  Tobacco Use  . Smoking status: Never Smoker  . Smokeless tobacco: Never Used  Vaping Use  . Vaping Use: Never used  Substance and Sexual Activity  . Alcohol use: Never  . Drug use: Never  . Sexual activity: Not Currently    Birth control/protection: None  Other Topics Concern  . Not on file  Social History Narrative  . Not on file   Social Determinants of Health   Financial Resource Strain:   . Difficulty of Paying Living Expenses: Not on file  Food Insecurity:   . Worried About Programme researcher, broadcasting/film/video in the Last Year: Not on file  . Ran Out of Food in the Last Year: Not on file  Transportation Needs:   . Lack of Transportation (Medical): Not on file  . Lack of Transportation (Non-Medical): Not on file  Physical Activity:   . Days of Exercise per Week: Not on file  .  Minutes of Exercise per Session: Not on file  Stress:   . Feeling of Stress : Not on file  Social Connections:   . Frequency of Communication with Friends and Family: Not on file  . Frequency of Social Gatherings with Friends and Family: Not on file  . Attends Religious Services: Not on file  . Active Member of Clubs or Organizations: Not on file  . Attends Banker Meetings: Not on file  . Marital Status: Not on file  Intimate Partner Violence:   . Fear of Current or  Ex-Partner: Not on file  . Emotionally Abused: Not on file  . Physically Abused: Not on file  . Sexually Abused: Not on file    Family History  Problem Relation Age of Onset  . Thyroid disease Mother   . Diabetes Father   . Hypertension Father   . Stroke Father   . Migraines Neg Hx     Past Medical History:  Diagnosis Date  . Edema, lower extremity   . GERD (gastroesophageal reflux disease)   . Hypertension    borderline in past.  . Hypothyroid   . Labral tear of left hip joint   . Migraine     Patient Active Problem List   Diagnosis Date Noted  . Migraine without aura, with intractable migraine, so stated, with status migrainosus 07/06/2020  . Hypertension 04/26/2020  . Hypothyroidism 04/26/2020    History reviewed. No pertinent surgical history.  Current Outpatient Medications  Medication Sig Dispense Refill  . amLODipine (NORVASC) 5 MG tablet Take 1 tablet (5 mg total) by mouth daily. 30 tablet 3  . famotidine (PEPCID) 20 MG tablet Take 1 tablet (20 mg total) by mouth daily. 30 tablet 0  . hydrochlorothiazide (MICROZIDE) 12.5 MG capsule Take 1 capsule (12.5 mg total) by mouth daily. 90 capsule 1  . levothyroxine (SYNTHROID) 25 MCG tablet 1.5 tab po daily 45 tablet 1  . metoprolol succinate (TOPROL-XL) 50 MG 24 hr tablet TAKE 1 TABLET (50 MG TOTAL) BY MOUTH DAILY TAKE WITH OR IMMEDIATELY FOLLOWING A MEAL 90 tablet 1  . Vitamin D, Ergocalciferol, (DRISDOL) 1.25 MG (50000 UNIT) CAPS capsule Take 1 capsule (50,000 Units total) by mouth every 7 (seven) days. 4 capsule 0   No current facility-administered medications for this visit.    Allergies as of 07/06/2020  . (No Known Allergies)    Vitals: BP (!) 130/93 (BP Location: Right Arm, Patient Position: Sitting)   Pulse 95   Ht 5\' 3"  (1.6 m)   Wt 167 lb (75.8 kg)   BMI 29.58 kg/m  Last Weight:  Wt Readings from Last 1 Encounters:  07/06/20 167 lb (75.8 kg)   Last Height:   Ht Readings from Last 1 Encounters:   07/06/20 5\' 3"  (1.6 m)     Physical exam: Exam: Gen: NAD, conversant, well nourised, well groomed                     CV: RRR, no MRG. No Carotid Bruits. No peripheral edema, warm, nontender Eyes: Conjunctivae clear without exudates or hemorrhage  Neuro: Detailed Neurologic Exam  Speech:    Speech is normal; fluent and spontaneous with normal comprehension.  Cognition:    The patient is oriented to person, place, and time;     recent and remote memory intact;     language fluent;     normal attention, concentration,     fund of knowledge Cranial Nerves:  The pupils are equal, round, and reactive to light. The fundi are flat. Visual fields are full to finger confrontation. Extraocular movements are intact. Trigeminal sensation is intact and the muscles of mastication are normal. The face is symmetric. The palate elevates in the midline. Hearing intact. Voice is normal. Shoulder shrug is normal. The tongue has normal motion without fasciculations.   Coordination:    No dysmetria or ataxia   Gait:    Normal native gait  Motor Observation:    No asymmetry, no atrophy, and no involuntary movements noted. Tone:    Normal muscle tone.    Posture:    Posture is normal. normal erect    Strength:  strength is V/V in the upper and lower limbs.      Sensation: numbness.tingling upper extremities     Reflex Exam:  DTR's:    Deep tendon reflexes in the upper and lower extremities are normal bilaterally.   Toes:    The toes are downgoing bilaterally.   Clonus:    Clonus is absent.    Assessment/Plan:  Patient with unusual intractable headache for months, severe and continuous last 2 weeks, focal neurologic deficits, needs CTV stat for cerebral venous thrombosis. Will send urgently today if negative she will return to clinic for migraine cocktail and other treatment to try to break status migrainosus.   Orders Placed This Encounter  Procedures  . CT VENOGRAM HEAD   No  orders of the defined types were placed in this encounter.   Cc: Saguier, Kateri Mc,  Milon Dikes, MD  Naomie Dean, MD  [facility] [address] [place], [place] [address]  Phone [phone] Fax [phone]

## 2020-07-08 ENCOUNTER — Telehealth: Payer: Self-pay | Admitting: Medical

## 2020-07-08 NOTE — Addendum Note (Signed)
Addended by: Gwenevere Abbot on: 07/08/2020 12:28 PM   Modules accepted: Orders

## 2020-07-08 NOTE — Telephone Encounter (Signed)
Opened to delete mri studies.

## 2020-07-13 ENCOUNTER — Other Ambulatory Visit: Payer: Self-pay | Admitting: Medical

## 2020-08-02 ENCOUNTER — Other Ambulatory Visit: Payer: Self-pay | Admitting: Medical

## 2020-08-03 ENCOUNTER — Telehealth: Payer: Self-pay | Admitting: *Deleted

## 2020-08-03 ENCOUNTER — Other Ambulatory Visit: Payer: Self-pay | Admitting: *Deleted

## 2020-08-03 ENCOUNTER — Encounter: Payer: Self-pay | Admitting: *Deleted

## 2020-08-03 MED ORDER — NURTEC 75 MG PO TBDP: 75.0000 mg | ORAL_TABLET | ORAL | 2 refills | Status: AC | PRN

## 2020-08-03 NOTE — Telephone Encounter (Signed)
Nurtec PA completed on Cover My Meds. Key: [id number]. Awaiting determination from [employer].

## 2020-08-16 NOTE — Telephone Encounter (Signed)
Per CMM, Nurtec has been denied but no reason was given.

## 2020-08-25 ENCOUNTER — Other Ambulatory Visit: Payer: Self-pay | Admitting: Medical

## 2020-08-27 ENCOUNTER — Other Ambulatory Visit: Payer: Self-pay | Admitting: Neurology

## 2020-08-27 MED ORDER — RIZATRIPTAN BENZOATE 10 MG PO TBDP: 10.0000 mg | ORAL_TABLET | ORAL | 11 refills | Status: AC | PRN

## 2020-08-27 MED ORDER — UBRELVY 100 MG PO TABS: 100.0000 mg | ORAL_TABLET | ORAL | 11 refills | Status: AC | PRN

## 2020-08-30 ENCOUNTER — Other Ambulatory Visit: Payer: Self-pay | Admitting: Medical

## 2020-08-31 ENCOUNTER — Telehealth: Payer: Self-pay | Admitting: *Deleted

## 2020-08-31 NOTE — Telephone Encounter (Signed)
Bernita Raisin PA completed on Cover My Meds. Key: [id number]. Awaiting determination from [employer]. Patient can use savings card if not approved.

## 2020-09-06 NOTE — Telephone Encounter (Signed)
Received a fax from [employer] (ph: [phone]) with a denial for Ubrelvy. The reason stated: coverage is provided for patients who tried at least one triptan or have a contraindication to triptans according to the prescriber. Case FV#[id number]. Pt GK#[id number].  Per Bethany's note below, the patient can use a savings card. I also left the patient a voicemail with this information (ok per DPR).

## 2020-09-17 ENCOUNTER — Other Ambulatory Visit: Payer: Self-pay | Admitting: Medical

## 2020-10-10 ENCOUNTER — Other Ambulatory Visit: Payer: Self-pay | Admitting: Medical

## 2020-10-11 ENCOUNTER — Other Ambulatory Visit: Payer: Self-pay | Admitting: Medical

## 2020-10-18 ENCOUNTER — Other Ambulatory Visit: Payer: Self-pay | Admitting: Medical

## 2020-11-05 MED FILL — METOPROLOL SUCCINATE ER 25MG TB24: 25 mg | 90 days supply | Qty: 90 | Fill #0 | Status: AC

## 2020-11-05 MED FILL — AMLODIPINE BESYLATE 5MG TABS: 5 mg | 90 days supply | Qty: 90 | Fill #0 | Status: AC

## 2020-11-05 MED FILL — RIZATRIPTAN BENZOATE ODT 10MG TBDP: 10 mg | 30 days supply | Qty: 18 | Fill #0 | Status: AC

## 2020-11-05 MED FILL — LEVOTHYROXINE SODIUM 25MCG TABS: 25 ug | 30 days supply | Qty: 45 | Fill #0 | Status: AC

## 2020-11-05 MED FILL — FAMOTIDINE 20MG TABS: 20 mg | 90 days supply | Qty: 90 | Fill #0 | Status: AC

## 2020-11-08 NOTE — Telephone Encounter (Signed)
Bernita Raisin PA received. Completed this on Cover My Meds. Key: [id number]. Awaiting determination.

## 2020-11-10 NOTE — Telephone Encounter (Signed)
The request has been approved. The authorization is effective for a maximum of 6 fills from 11/10/2020 to 05/12/2021, as long as the member is enrolled in their current health plan. The request was approved as submitted. This request has been approved with a quantity limit of 16 tablets per 30 days. Renewal requires that the request is for acute treatment of migraines and the patient has experienced an improvement from baseline in a validated acute treatment patient-reported outcome questionnaire (e.g., Migraine Assessment of Current Therapy [Migraine-ACT]) OR the patient has experienced clinical improvement as defined by one of the following: 1) ability to function normally within 2 hours of dose, 2) headache pain disappears within 2 hours of dose, or 3) therapy works consistently in majority of migraine attacks. A written notification letter will follow with additional details.

## 2020-11-11 MED FILL — UBRELVY 100MG TABS: 100 mg | 19 days supply | Qty: 10 | Fill #0 | Status: AC

## 2020-11-22 ENCOUNTER — Other Ambulatory Visit: Payer: Self-pay | Admitting: Medical

## 2020-11-25 ENCOUNTER — Other Ambulatory Visit: Payer: Self-pay | Admitting: Medical

## 2021-01-17 MED FILL — LEVOTHYROXINE SODIUM 25MCG TABS: 25 ug | 30 days supply | Qty: 45 | Fill #1 | Status: AC

## 2021-02-15 ENCOUNTER — Other Ambulatory Visit: Payer: Self-pay | Admitting: Medical

## 2021-02-15 ENCOUNTER — Other Ambulatory Visit (HOSPITAL_BASED_OUTPATIENT_CLINIC_OR_DEPARTMENT_OTHER): Payer: Self-pay

## 2021-02-16 ENCOUNTER — Other Ambulatory Visit (HOSPITAL_BASED_OUTPATIENT_CLINIC_OR_DEPARTMENT_OTHER): Payer: Self-pay

## 2021-02-16 MED FILL — MELOXICAM 15MG TABS: 15 mg | 30 days supply | Qty: 30 | Fill #0 | Status: AC

## 2021-02-16 MED FILL — LEVOTHYROXINE SODIUM 25MCG TABS: 25 ug | 60 days supply | Qty: 90 | Fill #0 | Status: AC

## 2021-02-17 MED FILL — FAMOTIDINE 20MG TABS: 20 mg | 90 days supply | Qty: 90 | Fill #1 | Status: AC

## 2021-02-17 MED FILL — AMLODIPINE BESYLATE 5MG TABS: 5 mg | 90 days supply | Qty: 90 | Fill #1 | Status: AC

## 2021-02-25 MED FILL — LEVOTHYROXINE SODIUM 75MCG TABS: 75 ug | 90 days supply | Qty: 90 | Fill #0 | Status: AC

## 2021-03-15 IMAGING — CT CT HEAD W/O CM
3 series · 16 of 47 positions shown, 19 images · non-contrast
Comparison: None.

CLINICAL DATA: Chronic headache. Paresthesia. Numbness and
tingling.

EXAM:
CT HEAD WITHOUT CONTRAST
TECHNIQUE: Contiguous axial images were obtained from the base of the skull
through the vertex without intravenous contrast.

[Series 3: head wo · axial · 0.40mm/px · z∈[-346,-220]mm · 10 of 31 slices shown, 13 images]
[im 3/31  brain]
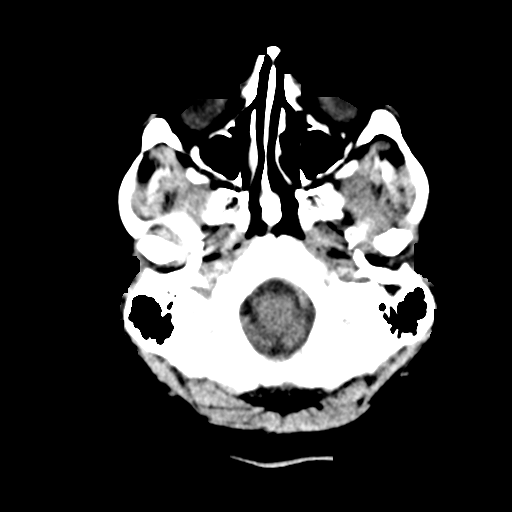
[im 3/31  bone]
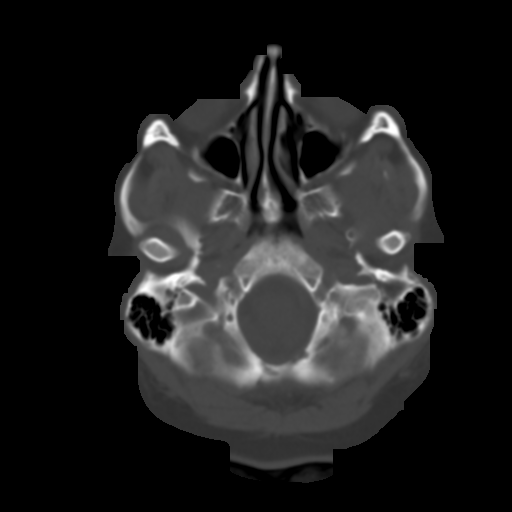
[im 6/31  brain]
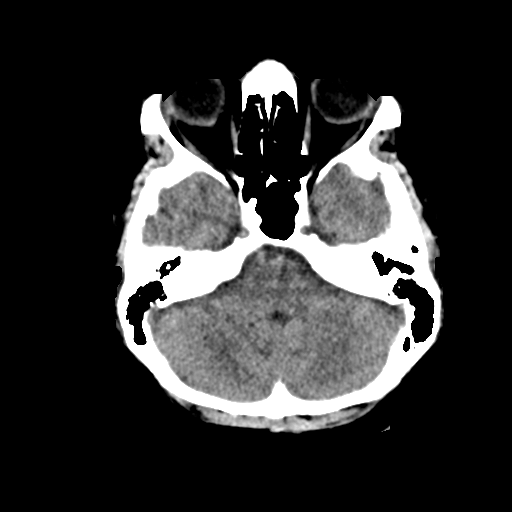
[im 9/31  brain]
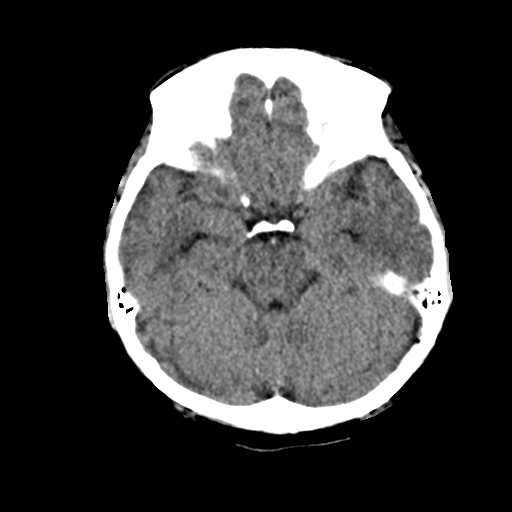
[im 11/31  brain]
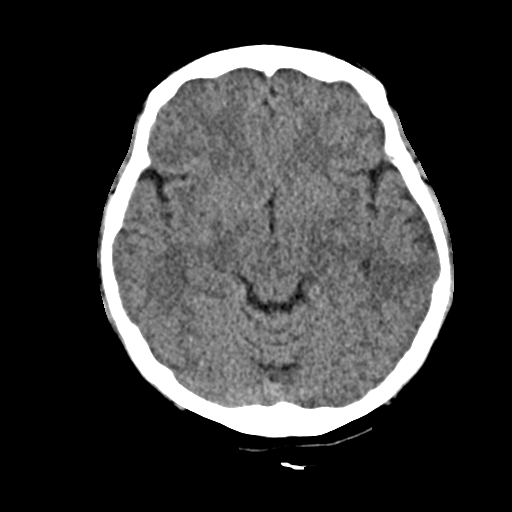
[im 14/31  brain]
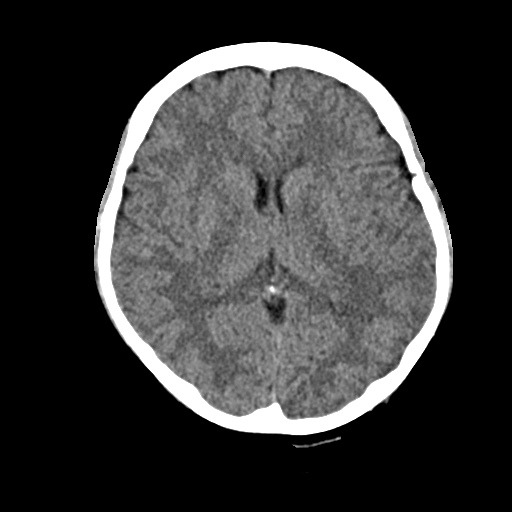
[im 14/31  bone]
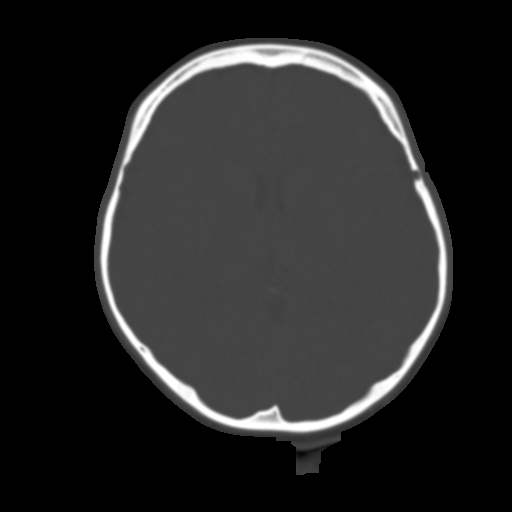
[im 17/31  brain]
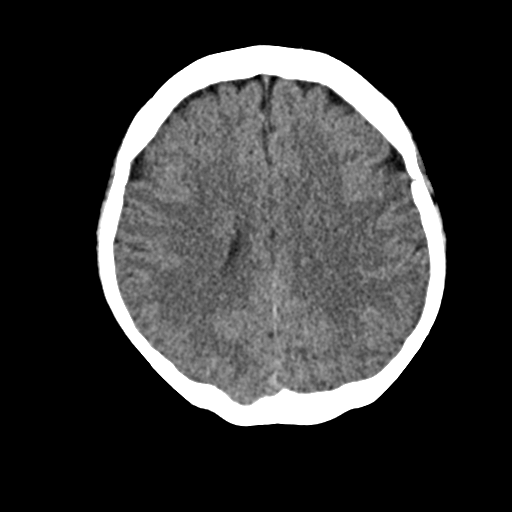
[im 20/31  brain]
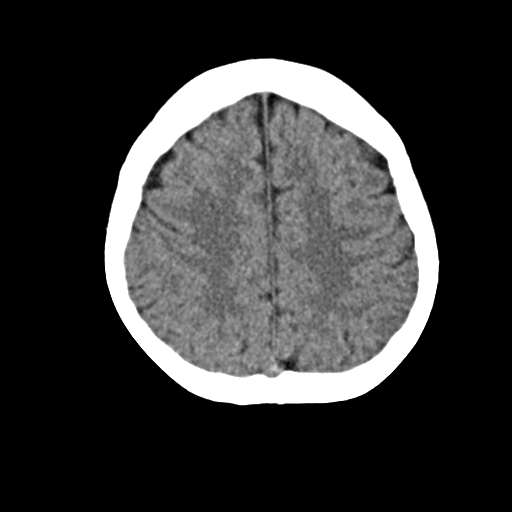
[im 23/31  brain]
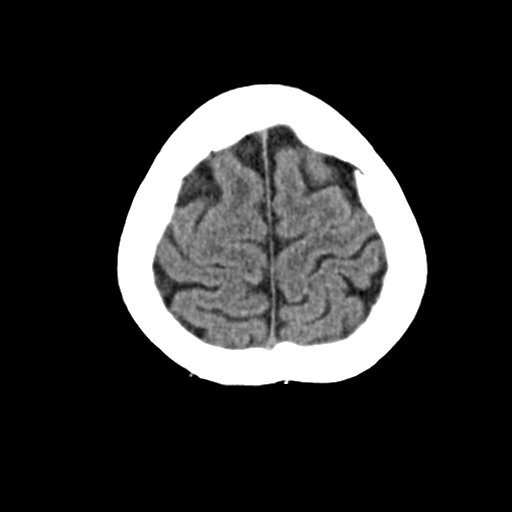
[im 25/31  brain]
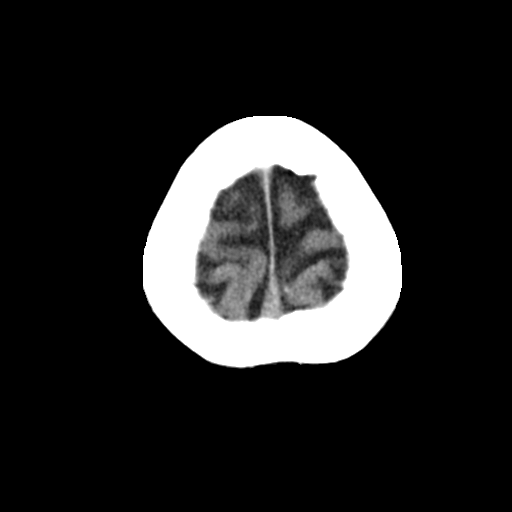
[im 25/31  bone]
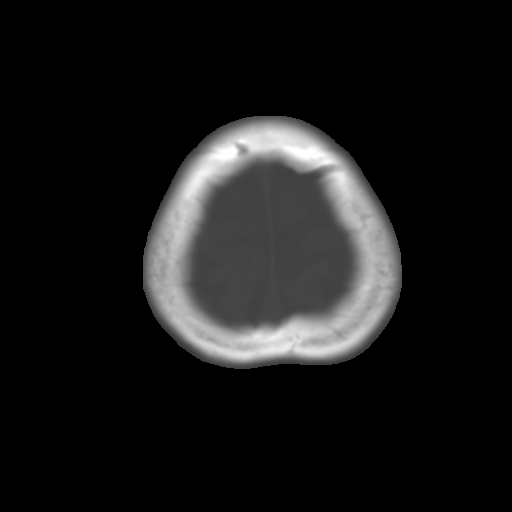
[im 28/31  brain]
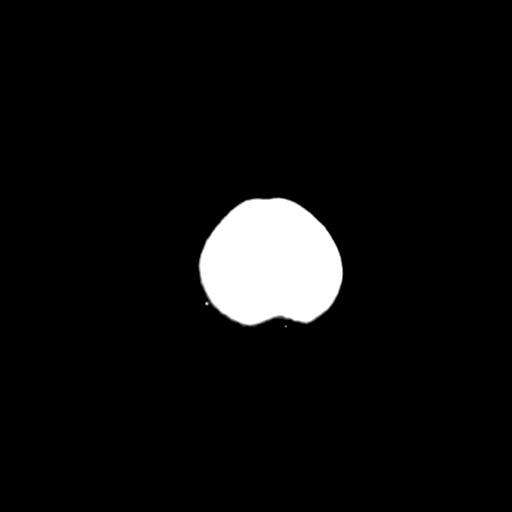

[Series 5: coronal soft · coronal · 0.32mm/px · 3 of 62 slices shown]
[im 21/62  brain]
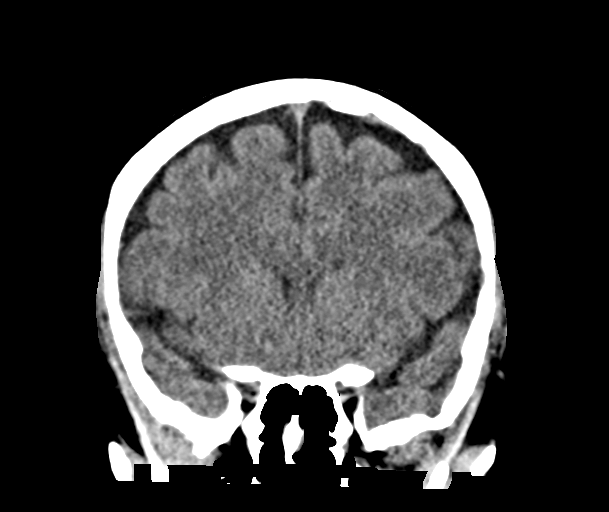
[im 28/62  brain]
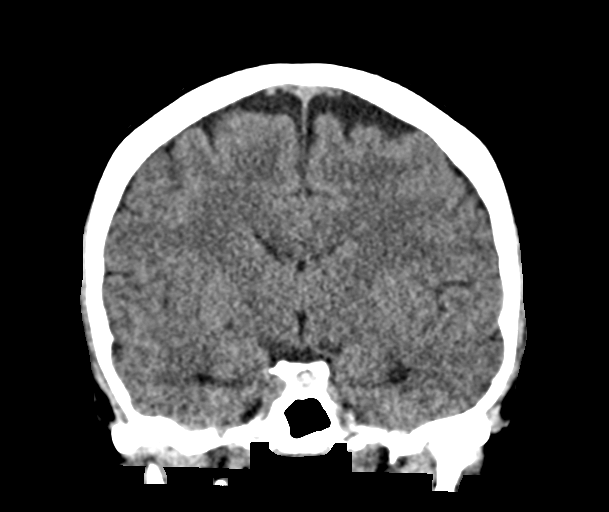
[im 34/62  brain]
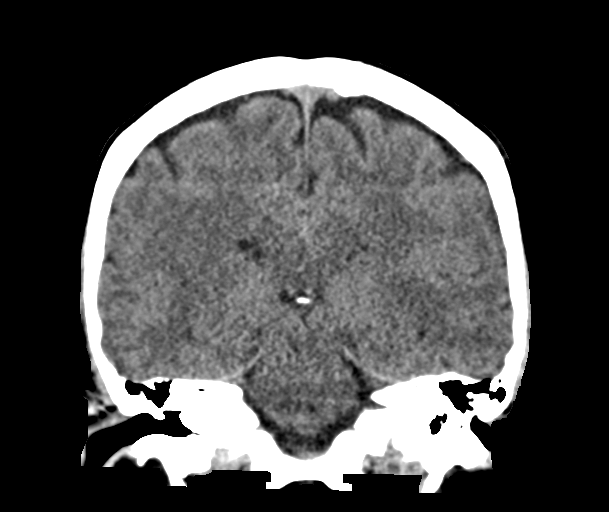

[Series 6: sag soft · sagittal · 0.32mm/px · 3 of 56 slices shown]
[im 19/56  brain]
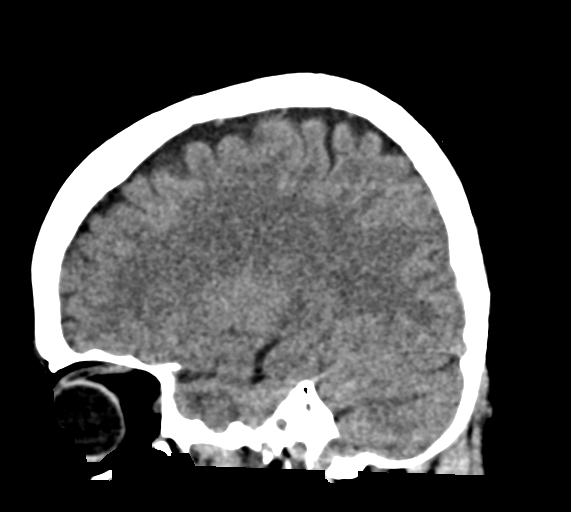
[im 28/56  brain]
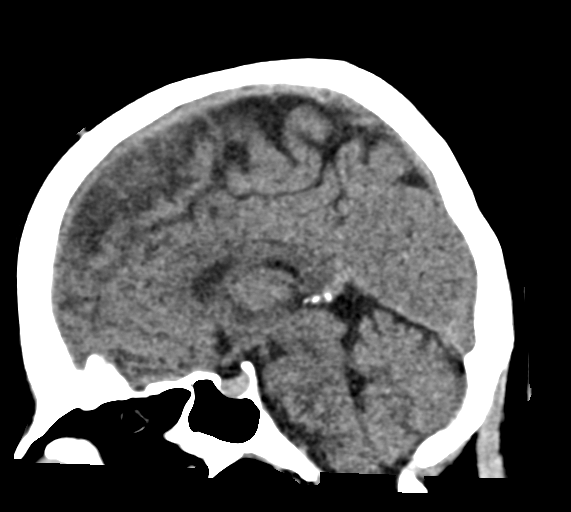
[im 37/56  brain]
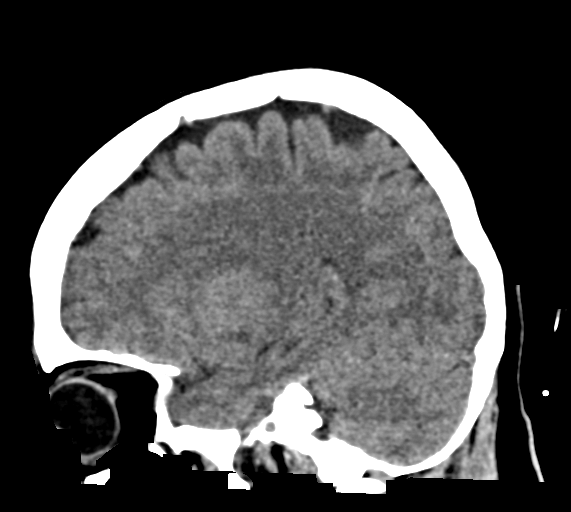

[16 of 47 positions shown; findings below may reference images not displayed]

FINDINGS: Brain: No evidence of acute infarction, hemorrhage, hydrocephalus,
extra-axial collection or mass lesion/mass effect.

Vascular: Negative for hyperdense vessel

Skull: Negative

Sinuses/Orbits: Paranasal sinuses clear.  Negative orbit

Other: None
IMPRESSION: Negative CT head

## 2021-04-25 MED FILL — METOPROLOL SUCCINATE ER 25MG TB24: 25 mg | 90 days supply | Qty: 90 | Fill #1 | Status: AC

## 2021-04-26 MED FILL — UBRELVY 100MG TABS: 100 mg | 30 days supply | Qty: 16 | Fill #1 | Status: AC

## 2021-05-02 ENCOUNTER — Telehealth: Payer: Self-pay

## 2021-05-02 ENCOUNTER — Telehealth: Payer: Self-pay | Admitting: *Deleted

## 2021-05-02 NOTE — Telephone Encounter (Signed)
I submitted a PA request for Ubrelvy on CMM, Key: [id number].   Awaiting determination from [employer].

## 2021-05-02 NOTE — Telephone Encounter (Signed)
PA for Bernita Raisin 100mg  has been approved  PA reference # [id number] Approval date: 05/02/2021-05/01/2022  Faxed approval letter with pharmacy on file

## 2021-05-02 NOTE — Telephone Encounter (Signed)
Formatting of this note might be different from the original.  PA for Ubrelvy 100mg  has been approved    PA reference # [id number]  Approval date: 05/02/2021-05/01/2022    Faxed approval letter with pharmacy on file   Electronically signed by 05/03/2022, CMA at 05/02/2021  2:12 PM EDT

## 2021-05-30 MED FILL — FAMOTIDINE 20MG TABS: 20 mg | 90 days supply | Qty: 90 | Fill #0 | Status: AC

## 2021-05-30 MED FILL — AMLODIPINE BESYLATE 5MG TABS: 5 mg | 90 days supply | Qty: 90 | Fill #0 | Status: AC

## 2021-05-30 MED FILL — LEVOTHYROXINE SODIUM 100MCG TABS: 100 ug | 90 days supply | Qty: 90 | Fill #0 | Status: AC

## 2021-06-16 MED FILL — METOPROLOL SUCCINATE ER 25MG TB24: 25 mg | 90 days supply | Qty: 90 | Fill #0 | Status: AC

## 2021-06-17 ENCOUNTER — Ambulatory Visit
Attending: Student in an Organized Health Care Education/Training Program | Primary: Student in an Organized Health Care Education/Training Program

## 2021-06-17 ENCOUNTER — Ambulatory Visit
Admit: 2021-06-17 | Discharge: 2021-06-17 | Payer: BLUE CROSS/BLUE SHIELD | Attending: Student in an Organized Health Care Education/Training Program

## 2021-06-17 DIAGNOSIS — Z0000 Encounter for general adult medical examination without abnormal findings: Secondary | ICD-10-CM

## 2021-06-17 MED ORDER — AMLODIPINE 10 MG TAB
10 mg | ORAL_TABLET | Freq: Every day | ORAL | 3 refills | Status: DC
Start: 2021-06-17 — End: 2021-08-01
  Filled 2021-06-17: qty 30, 30d supply, fill #0

## 2021-06-17 NOTE — Progress Notes (Signed)
 Progress  Notes by Katrinka Houston at 06/17/21 0900                Author: Katrinka Houston  Service: --  Author Type: Medical Assistant       Filed: 06/17/21 1814  Encounter Date: 06/17/2021  Status: Signed          Editor: Katrinka Houston (Medical Assistant)                 Chief Complaint       Patient presents with        ?  Establish Care        Visit Vitals      BP  121/83 (BP 1 Location: Left upper arm, BP Patient Position: Sitting, BP Cuff Size: Adult)     Pulse  92     Temp  98 F (36.7 C) (Temporal)     Ht  5' 3 (1.6 m)     Wt  183 lb (83 kg)     SpO2  100%        BMI  32.42 kg/m

## 2021-06-17 NOTE — Progress Notes (Signed)
Progress  Notes by Oren Binet, MD at 06/17/21 0900                Author: Oren Binet, MD  Service: --  Author Type: Physician       Filed: 06/19/21 0841  Encounter Date: 06/17/2021  Status: Signed          Editor: Oren Binet, MD (Physician)               [facility] Attending Addendum:   Patient encounter was discussed on the day of the encounter with Lance Morin, DO, performing the key elements of the service.  I discussed the findings, assessment and plan with the resident and agree  with the resident's findings and plan as documented in the resident's note.        Salley Scarlet, MD, CAQSM, RMSK

## 2021-06-17 NOTE — Progress Notes (Signed)
Progress  Notes by Lance Morin, DO at 06/17/21 0900                Author: Lance Morin, DO  Service: --  Author Type: Resident       Filed: 06/17/21 1814  Encounter Date: 06/17/2021  Status: Signed          Editor: Lance Morin, DO (Resident)               Maleaha Hughett is an 35 y.o. female who presents to establish care    Patient was previously receiving care at: in [place], [place]      Hypothyroidism   -  Synthroid increased 4 weeks ago to by prior PCP   - complaining of weight gain, some hair loss, fatigue      HTN   - on amlodipine 5mg  and metoprolol 25 XL   - at home runs 120/80s      PSH: none    OB Hx: no children.  Actively working on conception since May. Does not follow with OB but would like to get established    Gyn Hx: no abnormal pap smears, last 06/2020   FH: hypothyroidism (mom), pancreatic cancer (dad 64s)   SH: works as a [employer]s at [employer] but thinks she may stop soon, looking to go into [employer] (husband works here).  No alcohol, drugs, tobacco.         Current Medications   Current medications include:      Current Outpatient Medications        Medication  Sig         ?  levothyroxine (SYNTHROID) 100 mcg tablet       ?  famotidine (PEPCID) 20 mg tablet       ?  amLODIPine (NORVASC) 10 mg tablet  Take 1 Tablet by mouth daily.         ?  Ubrelvy 100 mg tablet            No current facility-administered medications for this visit.           Allergies   Not on File      Past Medical History   History reviewed. No pertinent past medical history.      Past Surgical History    History reviewed. No pertinent surgical history.      Family History     Family History         Problem  Relation  Age of Onset          ?  Thyroid Disease  Mother       ?  Diabetes  Father            ?  Stroke  Paternal Grandfather             Social History     Social History          Socioeconomic History         ?  Marital status:  MARRIED              Spouse name:  Not on file         ?  Number of  children:  Not on file     ?  Years of education:  Not on file     ?  Highest education level:  Not on file       Occupational History        ?  Not on file       Tobacco Use         ?  Smoking status:  Never     ?  Smokeless tobacco:  Never       Vaping Use         ?  Vaping Use:  Not on file       Substance and Sexual Activity         ?  Alcohol use:  Never     ?  Drug use:  Never     ?  Sexual activity:  Not on file        Other Topics  Concern        ?  Not on file       Social History Narrative        ?  Not on file          Social Determinants of Health          Financial Resource Strain: Not on file     Food Insecurity: Not on file     Transportation Needs: Not on file     Physical Activity: Unknown        ?  Days of Exercise per Week: 2 days     ?  Minutes of Exercise per Session: Not on file       Stress: Not on file     Social Connections: Not on file     Intimate Partner Violence: Not At Risk        ?  Fear of Current or Ex-Partner: No     ?  Emotionally Abused: No     ?  Physically Abused: No     ?  Sexually Abused: No       Housing Stability: Not on file           Immunizations     Immunization History        Administered  Date(s) Administered         ?  Influenza, FLUARIX, FLULAVAL, FLUZONE (age 61 mo+) AND AFLURIA, (age 44 y+), PF, 0.52mL  06/17/2021           Health Maintenance   Pap smear 06/2020        Objective     Vital Signs   Visit Vitals      BP  121/83 (BP 1 Location: Left upper arm, BP Patient Position: Sitting, BP Cuff Size: Adult)     Pulse  92     Temp  98 ??F (36.7 ??C) (Temporal)     Ht  5\' 3"  (1.6 m)     Wt  183 lb (83 kg)     LMP  06/06/2021     SpO2  100%        BMI  32.42 kg/m??           Physical Examination   Physical Exam   Vitals and nursing note reviewed.    Constitutional:        Appearance: Normal appearance. She is obese.    HENT:       Head: Normocephalic.       Right Ear: Tympanic membrane, ear canal and external ear normal.       Left Ear: Tympanic membrane, ear canal and  external ear normal.       Nose: Nose normal.       Mouth/Throat:       Mouth: Mucous membranes are moist.  Pharynx: Oropharynx is clear.    Eyes:       Conjunctiva/sclera: Conjunctivae normal.    Neck:       Thyroid: No thyroid mass, thyromegaly or thyroid tenderness.    Cardiovascular:       Rate and Rhythm: Normal rate and regular rhythm.       Pulses: Normal pulses.       Heart sounds: Normal heart sounds.    Pulmonary:       Effort: Pulmonary effort is normal.       Breath sounds: Normal breath sounds.     Abdominal:       General: Abdomen is flat. Bowel sounds are normal.       Palpations: Abdomen is soft.     Musculoskeletal:       Cervical back: Normal range of motion and neck supple.       Right lower leg: No edema.       Left lower leg: No edema.    Skin:      General: Skin is warm.    Neurological:       Mental Status: She is alert.    Psychiatric:          Mood and Affect: Mood normal.          Behavior: Behavior normal.             Assessment:     Jacqui Headen is a 35 y.o. here to establish to care         Plan:     Diagnoses and all orders for this visit:      1. Annual physical exam   -     CBC WITH AUTOMATED DIFF; Future   -     METABOLIC PANEL, COMPREHENSIVE; Future   -     REFERRAL TO OBSTETRICS AND GYNECOLOGY - previously followed with    -     LIPID PANEL; Future   -     HEMOGLOBIN A1C WITH EAG; Future   -     INFLUENZA, FLUARIX, FLULAVAL, FLUZONE (AGE 22 MO+), AFLURIA(AGE 3Y+) IM, PF, 0.5 ML      2. Class 1 obesity due to excess calories without serious comorbidity with body mass index (BMI) of 32.0 to 32.9 in adult: patient would like comprehensive  testing as she is trying to conceive     -     LIPID PANEL; Future   -     HEMOGLOBIN A1C WITH EAG; Future      3. Hypertension: while feeling of fatigue likely due to thyroid issue, metoprolol possibly compounding.  BP 120/70-80s at home and in office.   Will do trial of increased amlodipine and discontinue metoprolol.   -     amLODIPine  (NORVASC) 10 mg tablet; Take 1 Tablet by mouth daily.       4. Acquired hypothyroidism   - currently on synthroid 100 mcg (dose increased 5 weeks ago)   -     TSH 3RD GENERATION; Future      Follow up in 1-2 weeks for blood pressure      I discussed the aforementioned diagnoses with the patient as well as the plan of care. All questions were answered and an AVS was provided.       I discussed this patient with Dr. Cathie Hoops (Attending Physician)            Signed By:   Lance Morin, DO

## 2021-06-17 NOTE — Progress Notes (Signed)
Routine labs wnl

## 2021-06-18 LAB — LIPID PANEL
CHOL/HDL Ratio: 2.5 (ref 0.0–5.0)
Chol/HDL Ratio: 2.5 (ref 0.0–5.0)
Cholesterol, Total: 183 MG/DL (ref <200)
Cholesterol, total: 183 MG/DL (ref <200)
HDL Cholesterol: 73 MG/DL
HDL: 73 MG/DL
LDL Calculated: 88.4 MG/DL (ref 0–100)
LDL, calculated: 88.4 MG/DL (ref 0–100)
Triglyceride: 108 MG/DL (ref <150)
Triglycerides: 108 MG/DL (ref <150)
VLDL Cholesterol Calculated: 21.6 MG/DL
VLDL, calculated: 21.6 MG/DL

## 2021-06-18 LAB — CBC WITH AUTO DIFFERENTIAL
Basophils %: 1 % (ref 0–1)
Basophils Absolute: 0 10*3/uL (ref 0.0–0.1)
Eosinophils %: 3 % (ref 0–7)
Eosinophils Absolute: 0.1 10*3/uL (ref 0.0–0.4)
Granulocyte Absolute Count: 0 10*3/uL (ref 0.00–0.04)
Hematocrit: 41.8 % (ref 35.0–47.0)
Hemoglobin: 12.9 g/dL (ref 11.5–16.0)
Immature Granulocytes: 0 % (ref 0.0–0.5)
Lymphocytes %: 37 % (ref 12–49)
Lymphocytes Absolute: 1.7 10*3/uL (ref 0.8–3.5)
MCH: 26.5 PG (ref 26.0–34.0)
MCHC: 30.9 g/dL (ref 30.0–36.5)
MCV: 85.8 FL (ref 80.0–99.0)
MPV: 11.8 FL (ref 8.9–12.9)
Monocytes %: 7 % (ref 5–13)
Monocytes Absolute: 0.3 10*3/uL (ref 0.0–1.0)
NRBC Absolute: 0 10*3/uL (ref 0.00–0.01)
Neutrophils %: 52 % (ref 32–75)
Neutrophils Absolute: 2.4 10*3/uL (ref 1.8–8.0)
Nucleated RBCs: 0 PER 100 WBC
Platelets: 262 10*3/uL (ref 150–400)
RBC: 4.87 M/uL (ref 3.80–5.20)
RDW: 14.1 % (ref 11.5–14.5)
WBC: 4.6 10*3/uL (ref 3.6–11.0)

## 2021-06-18 LAB — TSH 3RD GENERATION
TSH: 3.31 u[IU]/mL (ref 0.36–3.74)
TSH: 3.31 u[IU]/mL (ref 0.36–3.74)

## 2021-06-18 LAB — COMPREHENSIVE METABOLIC PANEL
ALT: 31 U/L (ref 12–78)
AST: 18 U/L (ref 15–37)
Albumin/Globulin Ratio: 1.3 (ref 1.1–2.2)
Albumin: 4.2 g/dL (ref 3.5–5.0)
Alkaline Phosphatase: 52 U/L (ref 45–117)
Anion Gap: 6 mmol/L (ref 5–15)
BUN: 8 MG/DL (ref 6–20)
Bun/Cre Ratio: 9 — ABNORMAL LOW (ref 12–20)
CO2: 26 mmol/L (ref 21–32)
Calcium: 9.4 MG/DL (ref 8.5–10.1)
Chloride: 108 mmol/L (ref 97–108)
Creatinine: 0.86 MG/DL (ref 0.55–1.02)
ESTIMATED GLOMERULAR FILTRATION RATE: >60 mL/min/{1.73_m2} (ref >60)
Globulin: 3.2 g/dL (ref 2.0–4.0)
Glucose: 94 mg/dL (ref 65–100)
Potassium: 4.6 mmol/L (ref 3.5–5.1)
Sodium: 140 mmol/L (ref 136–145)
Total Bilirubin: 0.5 MG/DL (ref 0.2–1.0)
Total Protein: 7.4 g/dL (ref 6.4–8.2)

## 2021-06-18 LAB — HEMOGLOBIN A1C W/EAG
Hemoglobin A1C: 5.3 % (ref 4.0–5.6)
eAG: 105 mg/dL

## 2021-06-18 LAB — METABOLIC PANEL, COMPREHENSIVE
A-G Ratio: 1.3 (ref 1.1–2.2)
ALT (SGPT): 31 U/L (ref 12–78)
AST (SGOT): 18 U/L (ref 15–37)
Albumin: 4.2 g/dL (ref 3.5–5.0)
Alk. phosphatase: 52 U/L (ref 45–117)
Anion gap: 6 mmol/L (ref 5–15)
BUN/Creatinine ratio: 9 — ABNORMAL LOW (ref 12–20)
BUN: 8 MG/DL (ref 6–20)
Bilirubin, total: 0.5 MG/DL (ref 0.2–1.0)
CO2: 26 mmol/L (ref 21–32)
Calcium: 9.4 MG/DL (ref 8.5–10.1)
Chloride: 108 mmol/L (ref 97–108)
Creatinine: 0.86 MG/DL (ref 0.55–1.02)
Globulin: 3.2 g/dL (ref 2.0–4.0)
Glucose: 94 mg/dL (ref 65–100)
Potassium: 4.6 mmol/L (ref 3.5–5.1)
Protein, total: 7.4 g/dL (ref 6.4–8.2)
Sodium: 140 mmol/L (ref 136–145)
eGFR: >60 mL/min/{1.73_m2} (ref >60)

## 2021-06-18 LAB — CBC WITH AUTOMATED DIFF
ABS. BASOPHILS: 0 10*3/uL (ref 0.0–0.1)
ABS. EOSINOPHILS: 0.1 10*3/uL (ref 0.0–0.4)
ABS. IMM. GRANS.: 0 10*3/uL (ref 0.00–0.04)
ABS. LYMPHOCYTES: 1.7 10*3/uL (ref 0.8–3.5)
ABS. MONOCYTES: 0.3 10*3/uL (ref 0.0–1.0)
ABS. NEUTROPHILS: 2.4 10*3/uL (ref 1.8–8.0)
ABSOLUTE NRBC: 0 10*3/uL (ref 0.00–0.01)
BASOPHILS: 1 % (ref 0–1)
EOSINOPHILS: 3 % (ref 0–7)
HCT: 41.8 % (ref 35.0–47.0)
HGB: 12.9 g/dL (ref 11.5–16.0)
IMMATURE GRANULOCYTES: 0 % (ref 0.0–0.5)
LYMPHOCYTES: 37 % (ref 12–49)
MCH: 26.5 PG (ref 26.0–34.0)
MCHC: 30.9 g/dL (ref 30.0–36.5)
MCV: 85.8 FL (ref 80.0–99.0)
MONOCYTES: 7 % (ref 5–13)
MPV: 11.8 FL (ref 8.9–12.9)
NEUTROPHILS: 52 % (ref 32–75)
NRBC: 0 PER 100 WBC
PLATELET: 262 10*3/uL (ref 150–400)
RBC: 4.87 M/uL (ref 3.80–5.20)
RDW: 14.1 % (ref 11.5–14.5)
WBC: 4.6 10*3/uL (ref 3.6–11.0)

## 2021-06-18 LAB — HEMOGLOBIN A1C WITH EAG
Est. average glucose: 105 mg/dL
Hemoglobin A1c: 5.3 % (ref 4.0–5.6)

## 2021-06-27 ENCOUNTER — Ambulatory Visit: Attending: Obstetrics & Gynecology | Primary: Student in an Organized Health Care Education/Training Program

## 2021-06-27 ENCOUNTER — Ambulatory Visit: Admit: 2021-06-27 | Discharge: 2021-06-27 | Payer: BLUE CROSS/BLUE SHIELD | Attending: Obstetrics & Gynecology

## 2021-06-27 DIAGNOSIS — Z01419 Encounter for gynecological examination (general) (routine) without abnormal findings: Secondary | ICD-10-CM

## 2021-06-27 NOTE — Progress Notes (Signed)
Annual exam ages 91-39    Rebecca Lyons is a No obstetric history on file.,  35 y.o. female   Patient's last menstrual period was 06/06/2021.    She presents for her annual checkup.     She is having no significant problems.      Menstrual status:    Her periods are normal in flow. She is using three to ten pads or tampons per day, usually regular with a 26-32 day interval with 3-7 day duration.    She does not have dysmenorrhea.    She reports no premenstrual symptoms.    Contraception:    The current method of family planning is none.    Sexual history:    She  has no history on file for sexual activity.    Medical conditions:    Since her last annual GYN exam about one year ago, she has not the following changes in her health history: none.     Surgical history confirmed with patient.  has no past surgical history on file.    Pap and Mammogram History:    Pap performed today.    The patient has never had a mammogram.    Breast Cancer History/Substance Abuse: negative    No past medical history on file.  No past surgical history on file.    Current Outpatient Medications   Medication Sig Dispense Refill    levothyroxine (SYNTHROID) 100 mcg tablet       famotidine (PEPCID) 20 mg tablet       amLODIPine (NORVASC) 10 mg tablet Take 1 Tablet by mouth daily. 30 Tablet 3    Ubrelvy 100 mg tablet  (Patient not taking: Reported on 06/27/2021)       Allergies: Patient has no allergy information on record.     Tobacco History:  reports that she has never smoked. She has never used smokeless tobacco.  Alcohol Abuse:  reports no history of alcohol use.  Drug Abuse:  reports no history of drug use.    Family Medical/Cancer History:   Family History   Problem Relation Age of Onset    Thyroid Disease Mother     Diabetes Father     Stroke Paternal Grandfather         Review of Systems - History obtained from the patient  Constitutional: negative for weight loss, fever, night sweats  HEENT: negative for hearing loss, earache,  congestion, snoring, sorethroat  CV: negative for chest pain, palpitations, edema  Resp: negative for cough, shortness of breath, wheezing  GI: negative for change in bowel habits, abdominal pain, black or bloody stools  GU: negative for frequency, dysuria, hematuria, vaginal discharge  MSK: negative for back pain, joint pain, muscle pain  Breast: negative for breast lumps, nipple discharge, galactorrhea  Skin :negative for itching, rash, hives  Neuro: negative for dizziness, headache, confusion, weakness  Psych: negative for anxiety, depression, change in mood  Heme/lymph: negative for bleeding, bruising, pallor    Physical Exam    Visit Vitals  BP 126/86   Wt 184 lb (83.5 kg)   LMP 06/06/2021   BMI 32.59 kg/m??       Constitutional  Appearance: well-nourished, well developed, alert, in no acute distress    HENT  Head and Face: appears normal    Neck  Inspection/Palpation: normal appearance, no masses or tenderness  Lymph Nodes: no lymphadenopathy present  Thyroid: gland size normal, nontender, no nodules or masses present on palpation    Chest  Respiratory Effort: breathing unlabored    Breasts  Inspection of Breasts: breasts symmetrical, no skin changes, no discharge present, nipple appearance normal, no skin retraction present  Palpation of Breasts and Axillae: no masses present on palpation, no breast tenderness  Axillary Lymph Nodes: no lymphadenopathy present    Gastrointestinal  Abdominal Examination: abdomen non-tender to palpation, normal bowel sounds, no masses present  Liver and spleen: no hepatomegaly present, spleen not palpable  Hernias: no hernias identified    Genitourinary  External Genitalia: normal appearance for age, no discharge present, no tenderness present, no inflammatory lesions present, no masses present, no atrophy present  Vagina: normal vaginal vault without central or paravaginal defects, no discharge present, no inflammatory lesions present, no masses present  Bladder: non-tender to  palpation  Urethra: appears normal  Cervix: normal   Uterus: normal size, shape and consistency  Adnexa: no adnexal tenderness present, no adnexal masses present  Perineum: perineum within normal limits, no evidence of trauma, no rashes or skin lesions present  Anus: anus within normal limits, no hemorrhoids present  Inguinal Lymph Nodes: no lymphadenopathy present    Skin  General Inspection: no rash, no lesions identified    Neurologic/Psychiatric  Mental Status:  Orientation: grossly oriented to person, place and time  Mood and Affect: mood normal, affect appropriate    Assessment:  Routine gynecologic examination  Her current medical status is satisfactory with no evidence of significant gynecologic issues.  Trying for pregnancy- will check 21 day progesterone today, if anovulatory then will repeat next month on day 22 (since 29 day cycles). Discussed Ovulation predictor kits, etc.     Plan:  Counseled re: diet, exercise, healthy lifestyle  Return for yearly wellness visits  Pt counseled regarding co-testing for high risk HPV with pap  Rec screening mammo at either 36 or 41

## 2021-06-29 LAB — PROGESTERONE
PROGESTERONE,PROGS: 12.4 ng/mL
Progesterone: 12.4 ng/mL

## 2021-06-30 LAB — PAP IG, HPV RFX HPV 16/18,45
.: 0
HPV APTIMA: NEGATIVE
HPV Aptima: NEGATIVE
LABCORP 019018: 0

## 2021-07-01 ENCOUNTER — Encounter
Payer: BLUE CROSS/BLUE SHIELD | Attending: Student in an Organized Health Care Education/Training Program | Primary: Student in an Organized Health Care Education/Training Program

## 2021-07-08 ENCOUNTER — Telehealth
Attending: Student in an Organized Health Care Education/Training Program | Primary: Student in an Organized Health Care Education/Training Program

## 2021-07-08 ENCOUNTER — Encounter
Payer: BLUE CROSS/BLUE SHIELD | Attending: Student in an Organized Health Care Education/Training Program | Primary: Student in an Organized Health Care Education/Training Program

## 2021-07-08 DIAGNOSIS — K219 Gastro-esophageal reflux disease without esophagitis: Secondary | ICD-10-CM

## 2021-07-08 MED ORDER — PANTOPRAZOLE 40 MG TAB, DELAYED RELEASE
40 mg | ORAL_TABLET | Freq: Every day | ORAL | 0 refills | Status: AC
Start: 2021-07-08 — End: 2021-08-01
  Filled 2021-07-11: qty 30, 30d supply, fill #0

## 2021-07-08 NOTE — Progress Notes (Signed)
[facility] Attending Addendum:  Dr. Taylor Love, DO,  the patient and I were not physically present during this encounter.  The resident and I are concurrently monitoring the patient care through appropriate telecommunication technology.  I discussed the findings, assessment and plan with the resident and agree with the resident's findings and plan as documented in the resident's note.      Billie Intriago W Lewis, MD

## 2021-07-08 NOTE — Progress Notes (Signed)
Progress  Notes by Lance Morin, DO at 07/08/21 0800                Author: Lance Morin, DO  Service: --  Author Type: Resident       Filed: 07/08/21 0819  Encounter Date: 07/08/2021  Status: Signed          Editor: Lance Morin, DO (Resident)                  Rebecca Lyons   35 y.o. female   07/05/1986   [address]   [place] [address]   [id number]       [facility]:     Telemedicine Progress Note   Lance Morin, DO           Encounter Date and Time: July 08, 2021 at 7:37 AM      Consent: Rebecca Lyons, who was seen by synchronous (real-time) audio-video technology, and/or her healthcare decision maker, is aware that this patient-initiated, Telehealth encounter on 07/08/2021  is a billable service, with coverage as determined by her insurance carrier. She is aware that she may receive a bill and has provided verbal consent to proceed: Yes.          Chief Complaint      Patient presents with      ?  Hypertension      ?  GERD               History of Present Illness         Rebecca Lyons was evaluated by synchronous (real-time) audio-video technology from [facility], through a secure patient portal.      Rebecca Lyons is a 35 y.o. female presents today for follow up of blood pressure.  Pereviously seen 10/7 to establish care, mentioned fatigue and concern thyroid function was low.  TSH checked and within normal limits on synthroid .  Decided to do  a trial off of metoprolol XL 25 and increase amlodipine for blood pressure.  Home pressures 120s/80s.  She has noticed that she still has some fatigue however feels it mainly occurs after nights of heavy snoring, has not had a sleep study.  Notes previous  snoring improved with 20lb weight loss.  Mentions that since increasing dose of thyroid in early Sept, she gets full earlier and has significantly worse GERD.  Taking pepcid 20mg  daily which previously controlled symptoms.  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  Eating a healthy diet.      Of note, established with Dr. (OBGYN), progesterone indicative of ovulation, pap smear NILM.         Review of Systems         Review of Systems    Constitutional:  Positive for malaise/fatigue. Negative for weight loss.         Weight gain    Gastrointestinal:  Positive for heartburn. Negative for abdominal pain, constipation, diarrhea, nausea and vomiting.       Vitals/Objective:            General:  alert, cooperative, no distress      Mental  status:  mental status: alert, oriented to person, place, and time, normal mood, behavior, speech, dress, motor activity, and thought processes      Resp:  resp: normal effort and no respiratory distress      Neuro:  neuro:  no gross deficits      Skin:  skin: no discoloration or lesions of concern on visible areas         Due to this being a TeleHealth evaluation, many elements of the physical examination are unable to be assessed.         Assessment and Plan:         Time-based coding, delete if not needed: I spent at least 10 minutes with this established patient, and >50% of the time was spent counseling and/or coordinating  care regarding acid reflux, blood pressure, fatigue.      Diagnoses and all orders for this visit:      1. Gastroesophageal reflux disease, unspecified whether esophagitis present   -     pantoprazole (PROTONIX) 40 mg tablet; Take 1 Tablet by mouth daily.   - If not controlled after 3-5 days of protonix, advised to add back in the pepcid   - Follow up in 6 weeks, if no improvement will refer to GI for EGD      2. Snoring   -     SLEEP MEDICINE REFERRAL   - weight loss encouraged      3. Hypertension, unspecified type: Stable off of metoprolol on increased amlodipine   - continue amlodipine 10      4. Acquired hypothyroidism: TSH 3.31   - Continue synthroid             We discussed the expected course, resolution and complications of the  diagnosis(es) in detail.  Medication risks, benefits, costs, interactions, and alternatives were discussed as indicated.  I advised her to contact the office if her condition worsens,  changes or fails to improve as anticipated. She expressed understanding with the diagnosis(es) and plan. Patient understands that this encounter was a temporary measure, and the importance of further follow up and examination was emphasized.  Patient  verbalized understanding.         Electronically Signed: Lance Morin, DO      CPT Codes [phone] for Established Patients may apply to this Telehealth Visit.  POS code: 17.  Modifier GT      Rebecca Lyons is a 35 y.o. female who was evaluated by an audio-video encounter for concerns as above. Patient identification was verified prior to start of the visit . A caregiver was present when appropriate. Due to this being a Scientist, research (medical) (During COVID-19 public health emergency) , evaluation of the following organ systems was limited: Vitals/Constitutional/EENT/Resp/CV/GI/GU/MS/Neuro/Skin/Heme-Lymph-Imm.   Pursuant to the emergency declaration under the [facility] Act and the [employer], 1135 waiver authority and the [employer] Act, this Virtual Visit was conducted, with patient's (and/or  legal guardian's) consent, to reduce the patient's risk of exposure to COVID-19 and provide necessary medical care.       Services were provided through a synchronous discussion virtually to substitute for in-person clinic visit. I was in the office. The patient was at home.        History         Patients past medical, surgical and family histories were reviewed and updated.        No past medical history on file.   No past surgical history on file.   Family History      Problem  Relation  Age of Onset      ?  Thyroid Disease  Mother        ?  Diabetes  Father        ?  Stroke  Paternal Grandfather              Social History             Tobacco Use      ?  Smoking status:  Never      ?  Smokeless tobacco:  Never      Substance Use Topics      ?  Alcohol use:  Never      ?  Drug use:  Never            Patient Active Problem List      Diagnosis  Code      ?  Acquired hypothyroidism  E03.9      ?  Hypertension  I10                    Current Medications/Allergies        Medications and Allergies reviewed:      Current Outpatient Medications      Medication  Sig  Dispense  Refill      ?  pantoprazole (PROTONIX) 40 mg tablet  Take 1 Tablet by mouth daily.  30 Tablet  0      ?  levothyroxine (SYNTHROID) 100 mcg tablet            ?  famotidine (PEPCID) 20 mg tablet            ?  Ubrelvy 100 mg tablet   (Patient not taking: Reported on 06/27/2021)          ?  amLODIPine (NORVASC) 10 mg tablet  Take 1 Tablet by mouth daily.  30 Tablet  3           Not on File

## 2021-07-18 ENCOUNTER — Encounter: Attending: Pediatric Pulmonology | Primary: Student in an Organized Health Care Education/Training Program

## 2021-07-27 NOTE — Progress Notes (Signed)
Previous records received and reviewed.  Notable for Pap smear 06/24/2020 ASC-US, HPV negative.  Patient has already established with OBGyn here.

## 2021-08-01 ENCOUNTER — Encounter

## 2021-08-01 ENCOUNTER — Encounter
Payer: BLUE CROSS/BLUE SHIELD | Attending: Pediatric Pulmonology | Primary: Student in an Organized Health Care Education/Training Program

## 2021-08-01 MED ORDER — AMLODIPINE 10 MG TAB
10 mg | ORAL_TABLET | Freq: Every day | ORAL | 2 refills | Status: AC
Start: 2021-08-01 — End: ?
  Filled 2021-08-03: qty 90, 90d supply, fill #0

## 2021-08-01 MED ORDER — FAMOTIDINE 20 MG TAB
20 mg | ORAL_TABLET | Freq: Every day | ORAL | 2 refills | Status: AC
Start: 2021-08-01 — End: 2021-10-27
  Filled 2021-08-03: qty 90, 90d supply, fill #0

## 2021-08-01 MED ORDER — PANTOPRAZOLE 40 MG TAB, DELAYED RELEASE
40 mg | ORAL_TABLET | Freq: Every day | ORAL | 2 refills | Status: AC
Start: 2021-08-01 — End: ?
  Filled 2021-08-03: qty 90, 90d supply, fill #0

## 2021-08-01 MED ORDER — LEVOTHYROXINE 100 MCG TAB
100 mcg | ORAL_TABLET | Freq: Every day | ORAL | 2 refills | Status: AC
Start: 2021-08-01 — End: ?
  Filled 2021-08-03: qty 90, 90d supply, fill #0

## 2021-08-01 NOTE — Telephone Encounter (Signed)
Telephone Encounter by Lance Morin, DO at 08/01/21 1736                Author: Lance Morin, DO  Service: --  Author Type: Resident       Filed: 08/01/21 1736  Encounter Date: 08/01/2021  Status: Signed          Editor: Lance Morin, DO (Resident)          From: Melvia HeapsLance Morin, DOSent: 08/01/2021  1:56 PM ESTSubject: RefillsGood Afternoon Dr. Joya Salm need a new prescription for Levothyroxine,  Norvasc, and Pepcid.Since they were last prescribed by my old PCP, I was not able to request a new refill from [facility] or MyChart.Thank you,-P

## 2021-08-23 ENCOUNTER — Encounter: Attending: Obstetrics & Gynecology | Primary: Student in an Organized Health Care Education/Training Program

## 2021-08-23 ENCOUNTER — Encounter: Primary: Student in an Organized Health Care Education/Training Program

## 2021-08-25 ENCOUNTER — Ambulatory Visit
Admit: 2021-08-25 | Discharge: 2021-08-25 | Payer: BLUE CROSS/BLUE SHIELD | Attending: Student in an Organized Health Care Education/Training Program | Primary: Student in an Organized Health Care Education/Training Program

## 2021-08-25 DIAGNOSIS — R051 Acute cough: Secondary | ICD-10-CM

## 2021-08-25 MED ORDER — BENZONATATE 200 MG CAP
200 mg | ORAL_CAPSULE | Freq: Three times a day (TID) | ORAL | 0 refills | Status: AC | PRN
Start: 2021-08-25 — End: 2021-09-08

## 2021-08-25 MED ORDER — LIDOCAINE 5 % (700 MG/PATCH) ADHESIVE PATCH
5 % | CUTANEOUS | 0 refills | Status: AC
Start: 2021-08-25 — End: 2021-10-18

## 2021-08-25 NOTE — Progress Notes (Signed)
Rebecca Lyons is a 35 y.o. female    No chief complaint on file.      1. Have you been to the ER, urgent care clinic since your last visit?  Hospitalized since your last visit?No  2. Have you seen or consulted any other health care providers outside of the [facility] since your last visit?  Include any pap smears or colon screening. No    There were no vitals taken for this visit.  3 most recent PHQ Screens 06/17/2021   Little interest or pleasure in doing things Not at all   Feeling down, depressed, irritable, or hopeless Not at all   Total Score PHQ 2 0     Health Maintenance Due   Topic Date Due    Hepatitis C Screening  Never done    COVID-19 Vaccine (1) Never done

## 2021-08-25 NOTE — Progress Notes (Signed)
Progress  Notes by Marylee Floras, MD at 08/25/21 1600                Author: Marylee Floras, MD  Service: --  Author Type: Resident       Filed: 08/28/21 1717  Encounter Date: 08/25/2021  Status: Signed          Editor: Marylee Floras, MD (Resident)                         Chief Complaint/Subjective:     HPI:   Rebecca Lyons is a 35 y.o. female that presents for:      Chief Complaint       Patient presents with        ?  Cough        # Cough:   - Onset: almost 4 weeks   - Context: Patient had a sore throat and a HA on 11/16 that lasted for about 5 days that subsequently developed into a cough. Tested herself  for COVID that was negative. Then she devleoped a fever > [facility] and again tested [facility] for COVID and flu > given lidocaine mouthwash along with nyquil. 3 days later (roughly 11/25?) went back to [facility], got an xray, given  Zpak, steroids, and tesslon along with an albuterol inhaler. Patient went back to urgent care a 3rd time (> 2 weeks ago) due to severe bouts of coughing and SOB > got another xray -- given doxy along with a steroid burst (which she did not take). Patient  had a low grade "fever" on 12/14 (Tmax 99.68F). She has had a persistent mostly non-productive cough from Sunday (12/11- ) prompting her to seek evaluation.  Does endorse upper rib pain. Taking dayquil/nyquil.      No history of asthma/COPD, no tobacco usage      COVID immunized      ROS:    Review of Systems    Constitutional: Negative.  Negative for chills and fever.    Respiratory:  Positive for cough and sputum production . Negative for hemoptysis, shortness of breath and wheezing.     Cardiovascular: Negative.  Negative for chest pain.    Gastrointestinal: Negative.  Negative for abdominal pain, diarrhea, nausea and vomiting.       Health Maintenance:     Health Maintenance Due        Topic  Date Due         ?  Hepatitis C Screening   Never done         ?  COVID-19 Vaccine (1)  Never done            Past  medical history, social history, and medications personally reviewed.   History reviewed. No pertinent past medical history.       Allergies personally reviewed.   Not on File            Objective:     Vitals reviewed.   Visit Vitals      BP  (!) 152/106 (BP 1 Location: Right arm, BP Patient Position: Sitting, BP Cuff Size: Adult)     Pulse  98     Temp  98.6 ??F (37 ??C)     Resp  15     Ht  5\' 3"  (1.6 m)     Wt  189 lb 3.2 oz (85.8 kg)     SpO2  98%  BMI  33.52 kg/m??            Physical Exam   General appearance - alert, well appearing, and in no distress   Chest - normal chest excursion, normal inspiratory and expiratory function.  Clear to ausculation bilaterally without audible wheezing. Coughing  throughout examination.     Heart - normal rate, regular rhythm, normal S1, S2, no murmurs, rubs, clicks or gallops, no extremity edema   Musculoskeletal - grossly normal joint and motor function.        Assessment/Plan:     Rebecca Lyons is here for evaluation of a cough.         Diagnoses and all orders for this visit:      1. Acute cough: Likely post-infectious residual cough with components of UACS vs GERD. Advised patient to optimize home remedies; warm teas, honey,  OTC cough suppressants, flonase, continued usage of home PPI, neti-pot. Will provide her high dose of tessalon. Reviewed XR completed by outside hospital -- no evidence of focal consolidation. No concerning findings on exam to warrant repeat CXR.    -     benzonatate (TESSALON) 200 mg capsule; Take 1 Capsule by mouth three (3) times daily as needed for Cough for up to 14 days.      2. Upper airway cough syndrome   -     benzonatate (TESSALON) 200 mg capsule; Take 1 Capsule by mouth three (3) times daily as needed for Cough for up to 14 days.      3. Costochondritis; patient will try heating pads and OTC pain regimen (provided with AVS)   -     lidocaine (LIDODERM) 5 %; Apply patch to the affected area for 12 hours a day and remove for 12 hours a  day.      4. Elevated blood pressure reading: Coughing during most of visit/exam and when vitals were obtained. Will have patient return for repeat reading  once coughing abates/improves.                Follow up:          Follow-up and Dispositions      ??  Return if symptoms worsen or fail to improve.                 Pt was discussed with Dr Mikey Bussing (attending physician).      I have reviewed pertinent labs results and other data. I have discussed the diagnosis with the patient and the intended plan as seen in the above orders. The patient has received an after-visit summary  and questions were answered concerning future plans. I have discussed medication side effects and warnings with the patient as well.      Rebecca Mowers Karie Mainland, MD   Resident [facility]   08/28/21

## 2021-08-25 NOTE — Progress Notes (Signed)
I reviewed the patient's medical history, the resident's findings on physical examination, the patient's diagnoses, and treatment plan as documented in the resident note.  I concur with the treatment plan as documented.

## 2021-10-18 ENCOUNTER — Ambulatory Visit: Attending: Obstetrics & Gynecology | Primary: Student in an Organized Health Care Education/Training Program

## 2021-10-18 ENCOUNTER — Ambulatory Visit
Admit: 2021-10-18 | Discharge: 2021-10-18 | Payer: PRIVATE HEALTH INSURANCE | Attending: Obstetrics & Gynecology | Primary: Student in an Organized Health Care Education/Training Program

## 2021-10-18 ENCOUNTER — Ambulatory Visit
Admit: 2021-10-18 | Discharge: 2021-10-18 | Payer: PRIVATE HEALTH INSURANCE | Primary: Student in an Organized Health Care Education/Training Program

## 2021-10-18 ENCOUNTER — Encounter

## 2021-10-18 ENCOUNTER — Encounter
Admit: 2021-10-18 | Payer: PRIVATE HEALTH INSURANCE | Primary: Student in an Organized Health Care Education/Training Program

## 2021-10-18 DIAGNOSIS — N939 Abnormal uterine and vaginal bleeding, unspecified: Secondary | ICD-10-CM

## 2021-10-18 DIAGNOSIS — N926 Irregular menstruation, unspecified: Secondary | ICD-10-CM

## 2021-10-18 LAB — AMB POC URINE PREGNANCY TEST, VISUAL COLOR COMPARISON
HCG urine, Ql. (POC): NEGATIVE
HCG, Pregnancy, Urine, POC: NEGATIVE

## 2021-10-18 MED ORDER — MEDROXYPROGESTERONE 10 MG TAB
10 mg | ORAL_TABLET | Freq: Every day | ORAL | 11 refills | Status: DC
Start: 2021-10-18 — End: 2021-10-18

## 2021-10-18 MED ORDER — MEDROXYPROGESTERONE 10 MG TAB
10 mg | ORAL_TABLET | Freq: Every day | ORAL | 11 refills | Status: AC
Start: 2021-10-18 — End: ?

## 2021-10-18 NOTE — Telephone Encounter (Signed)
36 year old patient seen in the office to day and is calling to have her prescription for medroxyPROGESTERone (PROVERA) 10 mg tablet [[id number]]     Order Details  Dose: 10 mg Route: Oral Frequency: DAILY   Dispense Quantity: 10 Tablet     Sent to a different pharmacy    Prescription resent as per Md order to patient requested pharmacy    Patient verbalized understanding.

## 2021-10-18 NOTE — Progress Notes (Signed)
Rebecca Lyons is a 36 y.o. female presents for a problem visit.    Chief Complaint   Patient presents with    Vaginal Bleeding    Ultrasound     Patient's last menstrual period was 09/10/2021.  Birth Control: none.  Last Pap: normal obtained 06/2021    The patient is reporting having: DUB  She reports normal cycles until November.  Her December period was late but otherwise normal.  She is trying for pregnancy.    Ultrasound today showed:    TRANSVAGINAL ULTRASOUND PERFORMED  UTERUS IS ANTEVERTED, NORMAL IN SIZE AND ECHOGENICITY.  ENDOMETRIUM MEASURES 9-10MM IN THICKNESS. NO EVIDENCE OF MASSES OR ABNORMALITIES ARE SEEN.  RIGHT OVARY APPEARS TO HAVE MULTIPLE PERIPHERY ARRANGED ATRETIC FOLLICLES <1CM.  LEFT OVARY APPEARS TO HAVE MULTIPLE PERIPHERY ARRANGED ATRETIC FOLLICLES <1CM.  FREE FLUID IS SEEN IN THE LEFT ADNEXA.  NO FREE FLUID SEEN IN THE CDS.        1. Have you been to the ER, urgent care clinic, or hospitalized since your last visit?No    2. Have you seen or consulted any other health care providers outside of the [facility] since your last visit? No    Examination chaperoned by Clydell Hakim, CMA.

## 2021-10-18 NOTE — Progress Notes (Signed)
GYN Problem Visit Note    Chief Complaint   Vaginal Bleeding and Ultrasound   Patient's last menstrual period was 09/10/2021.Marland Kitchen      HPI  Rebecca Lyons is a 36 y.o. female who complains of   Chief Complaint   Patient presents with    Vaginal Bleeding    Ultrasound     Pt reports heavy period in Nov, then normal cycle in Dec, no period in Jan.  Trying for pregnancy.     Per Nursing Note:    Patient's last menstrual period was 09/10/2021.  Birth Control: none.  Last Pap: normal obtained 06/2021    The patient is reporting having: DUB  She reports normal cycles until November.  Her December period was late but otherwise normal.  She is trying for pregnancy.    Ultrasound today showed:    TRANSVAGINAL ULTRASOUND PERFORMED  UTERUS IS ANTEVERTED, NORMAL IN SIZE AND ECHOGENICITY.  ENDOMETRIUM MEASURES 9-10MM IN THICKNESS. NO EVIDENCE OF MASSES OR ABNORMALITIES ARE SEEN.  RIGHT OVARY APPEARS TO HAVE MULTIPLE PERIPHERY ARRANGED ATRETIC FOLLICLES <1CM.  LEFT OVARY APPEARS TO HAVE MULTIPLE PERIPHERY ARRANGED ATRETIC FOLLICLES <1CM.  FREE FLUID IS SEEN IN THE LEFT ADNEXA.  NO FREE FLUID SEEN IN THE CDS.    No past medical history on file.  No past surgical history on file.  Social History     Occupational History    Not on file   Tobacco Use    Smoking status: Never    Smokeless tobacco: Never   Vaping Use    Vaping Use: Not on file   Substance and Sexual Activity    Alcohol use: Never    Drug use: Never    Sexual activity: Not on file     Family History   Problem Relation Age of Onset    Thyroid Disease Mother     Diabetes Father     Stroke Paternal Grandfather        No Known Allergies  Prior to Admission medications    Medication Sig Start Date End Date Taking? Authorizing Provider   medroxyPROGESTERone (PROVERA) 10 mg tablet Take 1 Tablet by mouth daily. Take for 10 days if no cycle in last 30 days 10/18/21  Yes Woodfin Ganja, MD   pantoprazole (PROTONIX) 40 mg tablet Take 1 Tablet by mouth daily. 08/01/21  Yes Love, Ladona Ridgel,  DO   levothyroxine (SYNTHROID) 100 mcg tablet Take 1 Tablet by mouth Daily (before breakfast). 08/01/21  Yes Love, Ladona Ridgel, DO   famotidine (PEPCID) 20 mg tablet Take 1 Tablet by mouth daily. 08/01/21  Yes Love, Taylor, DO   amLODIPine (NORVASC) 10 mg tablet Take 1 Tablet by mouth daily.  Patient taking differently: Take 5 mg by mouth daily. 08/01/21  Yes Love, Taylor, DO   lidocaine (LIDODERM) 5 % Apply patch to the affected area for 12 hours a day and remove for 12 hours a day. 08/25/21   Marylee Floras, MD        Review of Systems: History obtained from the patient  Constitutional: negative for weight loss, fever, night sweats  Breast: negative for breast lumps, nipple discharge, galactorrhea  GI: negative for change in bowel habits, abdominal pain, black or bloody stools  GU: negative for frequency, dysuria, hematuria, vaginal discharge  MSK: negative for back pain, joint pain, muscle pain  Skin: negative for itching, rash, hives  Psych: negative for anxiety, depression, change in mood      Objective:  Visit Vitals  LMP 09/10/2021       Physical Exam:   PHYSICAL EXAMINATION    Constitutional  Appearance: well-nourished, well developed, alert, in no acute distress    Gastrointestinal  Abdominal Examination: abdomen non-tender to palpation, normal bowel sounds, no masses present  Liver and spleen: no hepatomegaly present, spleen not palpable  Hernias: no hernias identified    Genitourinary  External Genitalia: normal appearance for age, no discharge present, no tenderness present, no inflammatory lesions present, no masses present, no atrophy present  Vagina: normal vaginal vault without central or paravaginal defects, no discharge present, no inflammatory lesions present, no masses present  Bladder: non-tender to palpation  Urethra: appears normal  Cervix: normal   Uterus: normal size, shape and consistency  Adnexa: no adnexal tenderness present, no adnexal masses present  Perineum: perineum within normal limits, no  evidence of trauma, no rashes or skin lesions present  Anus: anus within normal limits, no hemorrhoids present  Inguinal Lymph Nodes: no lymphadenopathy present    Skin  General Inspection: no rash, no lesions identified    Neurologic/Psychiatric  Mental Status:  Orientation: grossly oriented to person, place and time  Mood and Affect: mood normal, affect appropriate      ASSESSMENT/PLAN:    ICD-10-CM ICD-9-CM    1. Missed period  N92.6 626.4 AMB POC URINE PREGNANCY TEST, VISUAL COLOR COMPARISON      2. Family planning  Z30.09 V25.09       -likely PCOS (ultrasound consistent with). Will give provera withdrawal.    -will notify for 21 day progesterone  -SA info given for partner  -DIscussed femara if anovulatory    Orders Placed This Encounter    AMB POC URINE PREGNANCY TEST, VISUAL COLOR COMPARISON    medroxyPROGESTERone (PROVERA) 10 mg tablet     Sig: Take 1 Tablet by mouth daily. Take for 10 days if no cycle in last 30 days     Dispense:  10 Tablet     Refill:  11     RTO prn if symptoms persist or worsen.  Instructions given to pt.  Handouts given to pt.

## 2021-10-27 ENCOUNTER — Telehealth
Attending: Student in an Organized Health Care Education/Training Program | Primary: Student in an Organized Health Care Education/Training Program

## 2021-10-27 ENCOUNTER — Telehealth
Admit: 2021-10-27 | Discharge: 2021-10-27 | Payer: PRIVATE HEALTH INSURANCE | Attending: Student in an Organized Health Care Education/Training Program | Primary: Student in an Organized Health Care Education/Training Program

## 2021-10-27 DIAGNOSIS — R5381 Other malaise: Secondary | ICD-10-CM

## 2021-10-27 NOTE — Progress Notes (Signed)
[facility] Attending Addendum:  Dr. Taylor Love, DO,  the patient and I were not physically present during this encounter.  The resident and I are concurrently monitoring the patient care through appropriate telecommunication technology.  I discussed the findings, assessment and plan with the resident and agree with the resident's findings and plan as documented in the resident's note.      Boy Delamater, MD

## 2021-10-27 NOTE — Progress Notes (Signed)
Progress  Notes by Lance Morin, DO at 10/27/21 0830                Author: Lance Morin, DO  Service: --  Author Type: Resident       Filed: 10/27/21 1137  Encounter Date: 10/27/2021  Status: Addendum          Editor: Lance Morin, DO (Resident)          Related Notes: Original Note by Lance Morin, DO (Resident) filed at 10/27/21 0901                  Rebecca Lyons   36 y.o. female   Jul 03, 1986   [address]   [place] [address]   [id number]       Rebecca Lyons:     Telemedicine Progress Note   Lance Morin, DO           Encounter Date and Time: October 27, 2021 at 7:49 AM      Consent: Rebecca Lyons, who was seen by synchronous (real-time) audio-video technology, and/or her healthcare decision maker, is aware that this patient-initiated, Telehealth encounter on 10/27/2021 is  a billable service, with coverage as determined by her insurance carrier. She is aware that she may receive a bill and has provided verbal consent to proceed: Yes.          Chief Complaint      Patient presents with      ?  Malaise            History of Present Illness         Rebecca Lyons was evaluated by synchronous (real-time) audio-video technology from home, through a secure patient portal.      Rebecca Lyons is a 36 y.o. female presents today with a chief complaint of malaise that has been ongoing since establishing care with [place] in 06/2021.  General feeling of unwell is associated with hair loss and weight gain.  Hair loss coming out in clumps  with brushing daily.  Previously patient had been started on synthroid and lost 25lbs but has since regained.  At previous visit, beta blocker was discontinued without much change in energy level.  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  Stress has improved with job change however her and partner are trying to have ababy and strugglign with  fertility which is causing stress.  Working  with Dr. Mayford Knife, OBGyn, recent US suggestive of PCOS in addition to period irregularity.      In regards to BP, taking amlodipine 10mg , has minimal swelling towards the end of the day that is non-bothersome at this time.      GERD has resolved with protonix and dietary changes.         Vitals/Objective:            General:  alert, cooperative, no distress      Mental  status:  mental status: alert, oriented to person, place, and time, normal mood, behavior, speech, dress, motor activity, and thought processes      Resp:  resp: normal effort and no respiratory distress      Neuro:  neuro: no gross deficits      Skin:  skin: no discoloration or lesions of concern on visible areas         Due to this being a  TeleHealth evaluation, many elements of the physical examination are unable to be assessed.         Assessment and Plan:         Time-based coding, delete if not needed: I spent at least 15 minutes with this established patient, and >50% of the time was spent counseling and/or coordinating  care regarding malaise      Diagnoses and all orders for this visit:      1. Malaise: unclear etiology.  Possibly related to hypothyroidism, will check TSH in addition to other labs below.   -     TSH 3RD GENERATION; Future   -     VITAMIN D, 25 HYDROXY; Future   -     CBC WITH AUTOMATED DIFF; Future   -     VITAMIN B12; Future   -     METABOLIC PANEL, COMPREHENSIVE; Future   -     CORTISOL, AM; Future   - TSH previously 3.31, can target for less than 2.0 by increasing synthroid dose if needed considering patient symptomatic      2. PCOS (polycystic ovarian syndrome): Last A1c 5.3%, however hyperinsulinemia may be compensating for increased glucose levels.  Patient interested  in evaluating for insulin resistance given recent diagnosis of PCOS.  If hyperinsulinemia present, will reach out to OBGYN to see if metformin appropriate while trying to conceive   -     INSULIN; Future -  fasting      3. Hypertension, unspecified type   - continue amlodipine 10mg  for now, will need to switch if pregnancy occurs      4. Gastroesophageal reflux disease, unspecified whether esophagitis present   - continue protonix for now, will need to switch to pepcid BID if pregnancy occurs       Will discuss follow up pending lab results         Electronically Signed: Lance Morin, DO      CPT Codes [phone] for Established Patients may apply to this Telehealth Visit.  POS code: 58.  Modifier GT      Rebecca Lyons is a 36 y.o. female who was evaluated by an audio-video encounter for concerns as above. Patient identification was verified prior to start of the visit . A caregiver was present when appropriate. Due to this being a Scientist, research (medical) (During COVID-19 public health emergency) , evaluation of the following organ systems was limited: Vitals/Constitutional/EENT/Resp/CV/GI/GU/MS/Neuro/Skin/Heme-Lymph-Imm.   Pursuant to the emergency declaration under the [facility] Act and the [employer], 1135 waiver authority and the [employer] Act, this Virtual Visit was conducted, with patient's (and/or  legal guardian's) consent, to reduce the patient's risk of exposure to COVID-19 and provide necessary medical care.       Services were provided through a synchronous discussion virtually to substitute for in-person clinic visit. I was at home. The patient was at home.        History         Patients past medical, surgical and family histories were reviewed and updated.        No past medical history on file.   No past surgical history on file.   Family History      Problem  Relation  Age of Onset      ?  Thyroid Disease  Mother        ?  Diabetes  Father        ?  Stroke  Paternal Grandfather  Social History            Tobacco Use      ?  Smoking status:  Never      ?  Smokeless tobacco:  Never      Substance Use Topics      ?  Alcohol use:   Never      ?  Drug use:  Never            Patient Active Problem List      Diagnosis  Code      ?  Acquired hypothyroidism  E03.9      ?  Hypertension  I10      ?  PCOS (polycystic ovarian syndrome)  E28.2      ?  Gastroesophageal reflux disease  K21.9                    Current Medications/Allergies        Medications and Allergies reviewed:      Current Outpatient Medications      Medication  Sig  Dispense  Refill      ?  medroxyPROGESTERone (PROVERA) 10 mg tablet  Take 1 Tablet by mouth daily. Take for 10 days if no cycle in last 30 days  10 Tablet  11      ?  pantoprazole (PROTONIX) 40 mg tablet  Take 1 Tablet by mouth daily.  90 Tablet  2      ?  levothyroxine (SYNTHROID) 100 mcg tablet  Take 1 Tablet by mouth Daily (before breakfast).  90 Tablet  2      ?  amLODIPine (NORVASC) 10 mg tablet  Take 1 Tablet by mouth daily. (Patient taking differently: Take 5 mg by mouth daily.)  90 Tablet  2           No Known Allergies

## 2021-10-27 NOTE — Addendum Note (Signed)
 Addendum Note  by Georgina Moats at 10/27/21 0830                Author: Georgina Moats  Service: --  Author Type: --       Filed: 10/31/21 0733  Encounter Date: 10/27/2021  Status: Signed          Editor: Georgina Moats          Addended by: GEORGINA MOATS on: 10/31/2021 07:33 AM    Modules accepted: Orders

## 2021-10-27 NOTE — Addendum Note (Signed)
Addendum  Note by Lance Morin, DO at 10/27/21 0830                Author: Lance Morin, DO  Service: --  Author Type: Resident       Filed: 10/27/21 0854  Encounter Date: 10/27/2021  Status: Signed          Editor: Lance Morin, DO (Resident)          Addended by: Lance Morin on: 10/27/2021 08:54 AM    Modules accepted: Level of Service

## 2021-10-27 NOTE — Addendum Note (Signed)
Addendum  Note by Lance Morin, DO at 10/27/21 0830                Author: Lance Morin, DO  Service: --  Author Type: Resident       Filed: 10/27/21 0901  Encounter Date: 10/27/2021  Status: Signed          Editor: Lance Morin, DO (Resident)          Addended by: Lance Morin on: 10/27/2021 09:01 AM    Modules accepted: Orders

## 2021-10-31 ENCOUNTER — Encounter

## 2021-10-31 NOTE — Progress Notes (Signed)
Labs notable for vitamin D deficiency and insulin resistance, B12 on the lower side

## 2021-11-01 LAB — COMPREHENSIVE METABOLIC PANEL
ALT: 21 U/L (ref 12–78)
AST: 15 U/L (ref 15–37)
Albumin/Globulin Ratio: 1.3 (ref 1.1–2.2)
Albumin: 4.4 g/dL (ref 3.5–5.0)
Alkaline Phosphatase: 49 U/L (ref 45–117)
Anion Gap: 8 mmol/L (ref 5–15)
BUN: 13 MG/DL (ref 6–20)
Bun/Cre Ratio: 15 (ref 12–20)
CO2: 24 mmol/L (ref 21–32)
Calcium: 10 MG/DL (ref 8.5–10.1)
Chloride: 108 mmol/L (ref 97–108)
Creatinine: 0.84 MG/DL (ref 0.55–1.02)
ESTIMATED GLOMERULAR FILTRATION RATE: >60 mL/min/{1.73_m2} (ref >60)
Globulin: 3.3 g/dL (ref 2.0–4.0)
Glucose: 102 mg/dL — ABNORMAL HIGH (ref 65–100)
Potassium: 3.9 mmol/L (ref 3.5–5.1)
Sodium: 140 mmol/L (ref 136–145)
Total Bilirubin: 0.3 MG/DL (ref 0.2–1.0)
Total Protein: 7.7 g/dL (ref 6.4–8.2)

## 2021-11-01 LAB — CBC WITH AUTO DIFFERENTIAL
Basophils %: 1 % (ref 0–1)
Basophils Absolute: 0 10*3/uL (ref 0.0–0.1)
Eosinophils %: 3 % (ref 0–7)
Eosinophils Absolute: 0.1 10*3/uL (ref 0.0–0.4)
Granulocyte Absolute Count: 0 10*3/uL (ref 0.00–0.04)
Hematocrit: 37 % (ref 35.0–47.0)
Hemoglobin: 11.9 g/dL (ref 11.5–16.0)
Immature Granulocytes: 0 % (ref 0.0–0.5)
Lymphocytes %: 43 % (ref 12–49)
Lymphocytes Absolute: 1.9 10*3/uL (ref 0.8–3.5)
MCH: 25.9 PG — ABNORMAL LOW (ref 26.0–34.0)
MCHC: 32.2 g/dL (ref 30.0–36.5)
MCV: 80.6 FL (ref 80.0–99.0)
MPV: 11.2 FL (ref 8.9–12.9)
Monocytes %: 7 % (ref 5–13)
Monocytes Absolute: 0.3 10*3/uL (ref 0.0–1.0)
NRBC Absolute: 0 10*3/uL (ref 0.00–0.01)
Neutrophils %: 46 % (ref 32–75)
Neutrophils Absolute: 2.1 10*3/uL (ref 1.8–8.0)
Nucleated RBCs: 0 PER 100 WBC
Platelets: 283 10*3/uL (ref 150–400)
RBC: 4.59 M/uL (ref 3.80–5.20)
RDW: 13.5 % (ref 11.5–14.5)
WBC: 4.4 10*3/uL (ref 3.6–11.0)

## 2021-11-01 LAB — CORTISOL, AM
Cortisol - AM: 20 ug/dL (ref 4.30–22.45)
Cortisol, a.m.: 20 ug/dL (ref 4.30–22.45)

## 2021-11-01 LAB — TSH 3RD GENERATION
TSH: 2.05 u[IU]/mL (ref 0.36–3.74)
TSH: 2.05 u[IU]/mL (ref 0.36–3.74)

## 2021-11-01 LAB — VITAMIN B12
Vitamin B-12: 203 pg/mL (ref 193–986)
Vitamin B12: 203 pg/mL (ref 193–986)

## 2021-11-01 LAB — VITAMIN D 25 HYDROXY: Vit D, 25-Hydroxy: 14.6 ng/mL — ABNORMAL LOW (ref 30–100)

## 2021-11-01 LAB — METABOLIC PANEL, COMPREHENSIVE
A-G Ratio: 1.3 (ref 1.1–2.2)
ALT (SGPT): 21 U/L (ref 12–78)
AST (SGOT): 15 U/L (ref 15–37)
Albumin: 4.4 g/dL (ref 3.5–5.0)
Alk. phosphatase: 49 U/L (ref 45–117)
Anion gap: 8 mmol/L (ref 5–15)
BUN/Creatinine ratio: 15 (ref 12–20)
BUN: 13 MG/DL (ref 6–20)
Bilirubin, total: 0.3 MG/DL (ref 0.2–1.0)
CO2: 24 mmol/L (ref 21–32)
Calcium: 10 MG/DL (ref 8.5–10.1)
Chloride: 108 mmol/L (ref 97–108)
Creatinine: 0.84 MG/DL (ref 0.55–1.02)
Globulin: 3.3 g/dL (ref 2.0–4.0)
Glucose: 102 mg/dL — ABNORMAL HIGH (ref 65–100)
Potassium: 3.9 mmol/L (ref 3.5–5.1)
Protein, total: 7.7 g/dL (ref 6.4–8.2)
Sodium: 140 mmol/L (ref 136–145)
eGFR: >60 mL/min/{1.73_m2} (ref >60)

## 2021-11-01 LAB — CBC WITH AUTOMATED DIFF
ABS. BASOPHILS: 0 10*3/uL (ref 0.0–0.1)
ABS. EOSINOPHILS: 0.1 10*3/uL (ref 0.0–0.4)
ABS. IMM. GRANS.: 0 10*3/uL (ref 0.00–0.04)
ABS. LYMPHOCYTES: 1.9 10*3/uL (ref 0.8–3.5)
ABS. MONOCYTES: 0.3 10*3/uL (ref 0.0–1.0)
ABS. NEUTROPHILS: 2.1 10*3/uL (ref 1.8–8.0)
ABSOLUTE NRBC: 0 10*3/uL (ref 0.00–0.01)
BASOPHILS: 1 % (ref 0–1)
EOSINOPHILS: 3 % (ref 0–7)
HCT: 37 % (ref 35.0–47.0)
HGB: 11.9 g/dL (ref 11.5–16.0)
IMMATURE GRANULOCYTES: 0 % (ref 0.0–0.5)
LYMPHOCYTES: 43 % (ref 12–49)
MCH: 25.9 PG — ABNORMAL LOW (ref 26.0–34.0)
MCHC: 32.2 g/dL (ref 30.0–36.5)
MCV: 80.6 FL (ref 80.0–99.0)
MONOCYTES: 7 % (ref 5–13)
MPV: 11.2 FL (ref 8.9–12.9)
NEUTROPHILS: 46 % (ref 32–75)
NRBC: 0 PER 100 WBC
PLATELET: 283 10*3/uL (ref 150–400)
RBC: 4.59 M/uL (ref 3.80–5.20)
RDW: 13.5 % (ref 11.5–14.5)
WBC: 4.4 10*3/uL (ref 3.6–11.0)

## 2021-11-01 LAB — VITAMIN D, 25 HYDROXY: Vitamin D 25-Hydroxy: 14.6 ng/mL — ABNORMAL LOW (ref 30–100)

## 2021-11-02 ENCOUNTER — Encounter

## 2021-11-02 LAB — INSULIN
Insulin: 28.1 u[IU]/mL — ABNORMAL HIGH (ref 2.6–24.9)
Insulin: 28.1 u[IU]/mL — ABNORMAL HIGH (ref 2.6–24.9)

## 2021-11-02 MED ORDER — CHOLECALCIFEROL (VITAMIN D3) 1,000 UNIT (25 MCG) TAB
ORAL_TABLET | Freq: Every day | ORAL | 1 refills | Status: AC
Start: 2021-11-02 — End: ?

## 2021-11-02 MED ORDER — METFORMIN 500 MG TAB
500 mg | ORAL_TABLET | ORAL | 1 refills | Status: DC
Start: 2021-11-02 — End: 2022-01-03

## 2021-11-07 ENCOUNTER — Encounter

## 2021-11-21 ENCOUNTER — Encounter

## 2021-11-21 ENCOUNTER — Encounter: Primary: Student in an Organized Health Care Education/Training Program

## 2021-11-22 LAB — PROGESTERONE
PROGESTERONE,PROGS: 0.5 ng/mL
Progesterone: 0.5 ng/mL

## 2021-11-22 MED ORDER — LETROZOLE 2.5 MG TAB
2.5 mg | ORAL_TABLET | Freq: Every day | ORAL | 0 refills | Status: AC
Start: 2021-11-22 — End: 2022-01-02

## 2021-12-06 ENCOUNTER — Encounter

## 2021-12-31 ENCOUNTER — Encounter

## 2022-01-02 MED ORDER — LETROZOLE 2.5 MG TAB
2.5 mg | ORAL_TABLET | Freq: Every day | ORAL | 0 refills | Status: AC
Start: 2022-01-02 — End: ?

## 2022-01-03 MED ORDER — METFORMIN 500 MG TAB
500 mg | ORAL_TABLET | ORAL | 1 refills | Status: AC
Start: 2022-01-03 — End: ?

## 2022-01-04 MED ORDER — METFORMIN 500 MG TAB
500 mg | ORAL_TABLET | Freq: Two times a day (BID) | ORAL | 1 refills | Status: AC
Start: 2022-01-04 — End: ?

## 2022-01-05 NOTE — Telephone Encounter (Signed)
Telephone Encounter by Lance Morin, DO at 01/05/22 0092                Author: Lance Morin, DO  Service: --  Author Type: Resident       Filed: 01/05/22 0956  Encounter Date: 01/02/2022  Status: Signed          Editor: Lance Morin, DO (Resident)          From: Melvia HeapsLance Morin, DOSent: 01/02/2022 10:54 AM EDTSubject: Follow up LabsDr. Gregor Hams this message finds you all well. [place] so sorry about the delay in getting back to you all about the labs. I sent Dr. Mayford Knife a message as well asking if she wants me to get some labs as well since my cycles/ovulating hasnt been regular. I am just waiting to  hear back from her. I do need a refill on the Metformin - assuming that we are continuing that. As soon as she gets back to me then I can get the labs taken care. Is that okay?

## 2022-01-23 ENCOUNTER — Encounter

## 2022-01-23 NOTE — Addendum Note (Signed)
Addended by: Wallace Keller on: 01/23/2022 09:08 AM     Modules accepted: Orders

## 2022-01-24 LAB — CORTISOL AM, TOTAL: Cortisol - AM: 11.2 ug/dL (ref 4.30–22.45)

## 2022-01-24 LAB — TSH: TSH, 3RD GENERATION: 0.85 u[IU]/mL (ref 0.36–3.74)

## 2022-01-24 LAB — T4, FREE: T4 Free: 1.4 NG/DL (ref 0.8–1.5)

## 2022-01-24 LAB — VITAMIN D 25 HYDROXY: Vit D, 25-Hydroxy: 21.5 ng/mL — ABNORMAL LOW (ref 30–100)

## 2022-02-10 MED ORDER — LETROZOLE 2.5 MG PO TABS
2.5 MG | ORAL_TABLET | Freq: Every day | ORAL | 0 refills | Status: AC
Start: 2022-02-10 — End: 2022-02-15

## 2022-02-10 NOTE — Telephone Encounter (Signed)
Patient advised of work in MD,Dr Lavone Neri ,sent in requested prescription to patient preferred pharmacy    Patient verbalized understanding.    [place], MD  to Me    MH     9:03 AM  Script sent for Letrazole 7.5 mg QD days 5-7 of cycle

## 2022-02-10 NOTE — Telephone Encounter (Signed)
FW  patient        36 year old patient last seen in the office on 10/18/2021      Patient has taken a round of femara  taking 2 2.5 mg and got her cycle yesterday    Patient sent my chart message to provider on 01/02/2022    Woodfin Ganja, MD  to [facility]    FW      11:15 AM  I will send in double the dose (you will take 2 tabs on cycle days 3-7).  If you do not get pregnant this month then let me know and next month I will triple the dose.  If you are still not pregnant after that, then I would recommend follow-up with [facility] or [facility] and they can offer additional options.     Last read by Herbert Deaner at  5:50 PM on 02/09/2022.          Patient is calling to ask for the refill of the femara ( triple the dose) sent to her pharmacy  [facility]      Please advise    Tomorrow will be cycle day 3    Thank you

## 2022-03-09 NOTE — Telephone Encounter (Signed)
36 year old patient     Please advise regarding refill request      Need to send to [facility]    Please advise    Thank you

## 2022-03-10 MED ORDER — LETROZOLE 2.5 MG PO TABS
2.5 MG | ORAL_TABLET | Freq: Every day | ORAL | 0 refills | Status: AC
Start: 2022-03-10 — End: 2022-03-14

## 2022-03-10 NOTE — Telephone Encounter (Signed)
Prescription sent by Md, Dr. Mayford Knife    Patient was sent a mychart message    Woodfin Ganja, MD  to Me    FW     8:17 PM  Refilled, however she needs to see [facility], because the maximum dose of the femara is not working.  Can try this month while waiting for appt but should not use in future, should just follow [facility] next steps.

## 2022-04-05 ENCOUNTER — Encounter

## 2022-04-07 NOTE — Telephone Encounter (Signed)
Pt needs refill of metformin.

## 2022-04-09 MED ORDER — METFORMIN HCL 500 MG PO TABS
500 MG | ORAL_TABLET | Freq: Two times a day (BID) | ORAL | 0 refills | Status: DC
Start: 2022-04-09 — End: 2022-07-18

## 2022-04-10 NOTE — Telephone Encounter (Signed)
From: Herbert Deaner  To: Laymond Purser, MD  Sent: 04/05/2022 12:13 PM EDT  Subject: Metformin Rx    Dr. Nedra Hai,    I have an upcoming appointment with you in August. I need a new prescription for Metformin. I have been taking 1000mg  twice daily for PCOS. Dr. had started me on it a few months back.     My pharmacy [facility]) sent a request for the Metformin as well.     Look forward to seeing you soon.    Thank you.

## 2022-04-11 MED ORDER — METFORMIN HCL 1000 MG PO TABS
1000 MG | ORAL_TABLET | Freq: Two times a day (BID) | ORAL | 3 refills | Status: AC
Start: 2022-04-11 — End: 2022-07-18

## 2022-04-11 NOTE — Telephone Encounter (Signed)
Pt has requested this medication since 7/26. The refill that was sent on 7/30 was sent to the wrong pharmacy. She requested the medication to be sent to [facility] at [address] [place], [place], [place] [address]. The phone number is [phone].    Thank you

## 2022-04-14 ENCOUNTER — Ambulatory Visit
Attending: Student in an Organized Health Care Education/Training Program | Primary: Student in an Organized Health Care Education/Training Program

## 2022-04-19 ENCOUNTER — Encounter (INDEPENDENT_AMBULATORY_CARE_PROVIDER_SITE_OTHER): Payer: Self-pay

## 2022-04-24 ENCOUNTER — Ambulatory Visit
Attending: Student in an Organized Health Care Education/Training Program | Primary: Student in an Organized Health Care Education/Training Program

## 2022-05-04 ENCOUNTER — Ambulatory Visit
Attending: Student in an Organized Health Care Education/Training Program | Primary: Student in an Organized Health Care Education/Training Program

## 2022-05-30 NOTE — Progress Notes (Signed)
Formatting of this note is different from the original.  Reproductive Endocrinology & Infertility New Patient History and Physical    Chief Complaint:   Chief Complaint   Patient presents with   ? Infertility     New patient visit. Patient is here to see about options for infertility.        History of Present Illness:    36yo G0 TTC since April 2022.  Prior treatment with letrozole/TI x 4.  OPKs positive each month.  No prior hysterosalpingogram or semen analysis.    Diagnosed with PCOS but regular cycles until Oct 2022 and minimal chin hair hirsutism/hyperandrogenism.    Past Medical History:     Diagnosis Date   ? GERD (gastroesophageal reflux disease)    ? Hypothyroidism        Past Surgical History:   History reviewed. No pertinent surgical history.     Medications:  Current Outpatient Medications   Medication Sig Dispense Refill   ? amLODIPine (Norvasc) 10 MG tablet Take 1 tablet by mouth once daily 90 tablet 1   ? cholecalciferol (Vitamin D-1000 Max St) 25 MCG (1000 UT) tablet Take 1 Tablet by mouth daily. 90 tablet 1   ? famotidine (Pepcid) 20 MG tablet Take 1 tablet by mouth once daily 90 tablet 4   ? letrozole (Femara) 2.5 MG chemo tablet Take 3 tablets by mouth daily for 5 days Take on cycle days 3 to 7. 15 tablet 0   ? levothyroxine (Synthroid) 100 MCG tablet Take 1 tablet by mouth once daily before breakfast 90 tablet 1   ? medroxyPROGESTERone (Provera) 10 MG tablet Take 1 tablet by mouth daily, take for 10 days if no cycle in last 30 days 10 tablet 11   ? metFORMIN (Glucophage) 1000 MG tablet Take 1 tablet by mouth 2 times daily with meals 180 tablet 3   ? metFORMIN (Glucophage) 500 MG tablet Take 1 tablet by mouth daily for first week, then 1 tablet twice daily for week 2, then 2 tablets in morning and 1 tablet in evening for week 3, then 2 tablets in morning and 2 in the evening for week 4. 90 tablet 1   ? metFORMIN (Glucophage) 500 MG tablet Take 2 tablets by mouth two times daily with meals. 90  tablet 1   ? pantoprazole (Protonix) 40 MG EC tablet Take 1 tablet by mouth once daily 90 tablet 1     No current facility-administered medications for this visit.       Allergies:   No Known Allergies     Social History:   Social History     Tobacco Use   ? Smoking status: Never   ? Smokeless tobacco: Never   Vaping Use   ? Vaping Use: Never used   Substance Use Topics   ? Alcohol use: Never   ? Drug use: Never       Family History:  No family history on file.     Gynecologic History:   Menses: Regular   LMP: Patient's last menstrual period was 05/07/2022.   STI Hx: No prior STIs    Obstetric History:   OB History     Gravida   0    Para   0    Term   0    Preterm   0    AB   0    Living   0      SAB   0    IAB  0    Ectopic   0    Multiple   0    Live Births   0             Review of Systems:    Review of Systems     Partner:  Name: Tarry Kos  Prior semen analysis: None  Prior conceptions: None  Erectile dysfunction: None    Physical Exam:  Physical Exam     Transvaginal Ultrasound Findings:  Uterus AV 65 x 40 x 43 mm, normal  Em 10 mm, homogenous  Right ovary 27 x 19 mm, normal  Left ovary 38 x 22 mm, normal with CL    Assessment and Plan:     Ornella was seen today for infertility.  Diagnoses and all orders for this visit:  Female infertility, unspecified (Primary)  Appears ovulatory based on history and transvaginal ultrasound today.  Will get a P4 to confirm.  I'm questioning the PCOS diagnosis.    Will get an hysterosalpingogram and semen analysis for Nellie.      [name], MD    On the date of the encounter, I spent 30 minutes in patient care as described here: review of outside medical records including labs and imagining, patient counseling and evaluation, independent interpretation of labs, direct patient care, and documentation.   Electronically signed by [name], MD at 05/30/2022  2:59 PM EDT

## 2022-06-15 NOTE — Progress Notes (Signed)
Associated Order(s): [place] hysterosalpingogram  Formatting of this note might be different from the original.  FL hysterosalpingogram    Date/Time: 06/15/2022 2:50 PM    Performed by: Elodia Florence, LPN  Authorized by: [name], MD    Consent:     Consent obtained:  Verbal    Consent given by:  Patient    Risks discussed:  Bleeding and pain  Universal protocol:     Procedure explained and questions answered to patient or proxy's satisfaction: yes      Relevant documents present and verified: yes      Test results available: yes      Imaging studies available: yes      Required blood products, implants, devices, and special equipment available: no      Site/side marked: no      Immediately prior to procedure, a time out was called: yes      Patient identity confirmed:  Verbally with patient and arm band  Pre-procedure details:     Skin preparation:  Povidone-iodine  Sedation:     Sedation type:  None  Anesthesia:     Anesthesia method:  None      Electronically signed by Elodia Florence, LPN at 42/70/6237  6:28 PM EDT

## 2022-06-15 NOTE — Progress Notes (Signed)
Formatting of this note might be different from the original.  Patient ID: Rebecca Lyons is a 36 y.o. female.    Procedures     Hysterosalpingogram Note    Date of Procedure: 06/15/2022    Attending Staff  [name], MD    Resident  N/A, MD    Assistants, Nurses  Tiffany Buford    Diagnosis  Infertility    Consent for Operation or Procedure  Completed    Description of Operation/Procedure  After verifying a negative pregnancy test and obtaining informed consent, the patient was place on the fluoroscopy table in lithotomy position.  A time out was performed.  A speculum was advanced into the vagina and the cervix prepped with Betadine solution.  The HSG catheter was then advanced into the cervical canal and the balloon inflated.  The speculum was removed from the vagina.  Under fluoroscopic visualization 7 cc Omnipaque 300 contrast media was hand injected.  Findings are shown below.  The patient tolerated the procedure well.    Finding  Normal appearing uterine cavity. Bilateral fill and spill of contrast through normal appearing fallopian tubes.    [name], MD     Electronically signed by [name], MD at 06/15/2022  3:00 PM EDT

## 2022-07-03 NOTE — Telephone Encounter (Signed)
From: Nicholes Rough  To: Lowell Guitar, MD  Sent: 07/01/2022 12:38 PM EDT  Subject: Refills     Hello,    I need refills sent to my pharmacy for Levothyroxine 159mcg, Amlodipine 10mg , Pantoprazole 40 mg, and Famotidine 20 mg.    The pharmacy is [facility].    Please let me know if you all have any questions.    Thank you,   Shadi

## 2022-07-04 MED ORDER — LEVOTHYROXINE SODIUM 100 MCG PO TABS
100 MCG | ORAL_TABLET | Freq: Every day | ORAL | 1 refills | Status: AC
Start: 2022-07-04 — End: 2023-01-04

## 2022-07-04 MED ORDER — FAMOTIDINE 20 MG PO TABS
20 MG | ORAL_TABLET | Freq: Two times a day (BID) | ORAL | 1 refills | Status: AC
Start: 2022-07-04 — End: 2023-01-04

## 2022-07-04 MED ORDER — AMLODIPINE BESYLATE 10 MG PO TABS
10 MG | ORAL_TABLET | Freq: Every day | ORAL | 3 refills | Status: AC
Start: 2022-07-04 — End: 2023-01-04

## 2022-07-04 MED ORDER — PANTOPRAZOLE SODIUM 40 MG PO TBEC
40 MG | ORAL_TABLET | Freq: Every day | ORAL | 1 refills | Status: AC
Start: 2022-07-04 — End: 2023-01-04

## 2022-07-18 ENCOUNTER — Ambulatory Visit
Admit: 2022-07-18 | Discharge: 2022-07-18 | Payer: PRIVATE HEALTH INSURANCE | Attending: Student in an Organized Health Care Education/Training Program | Primary: Student in an Organized Health Care Education/Training Program

## 2022-07-18 DIAGNOSIS — E039 Hypothyroidism, unspecified: Secondary | ICD-10-CM

## 2022-07-18 NOTE — Progress Notes (Signed)
[facility]    Subjective   Rebecca Lyons is a 36 y.o. female who presents for Hypothyroidism (Follow up and labs prior to more refills.)    Hypothyroidism  - on levothyroxine 127mcg, tolerating well  - trying to get pregnant  - sees Dr. Erroll Luna at [facility], who started Metformin and ket Levothyroixine 158mcg  - also send reproductive endo, who stopped Metformin, thinking she never had PCOS  - Taking Clomid  - will start having levothyroxine filled with Dr. Maryann Conners, reproductive Endo  - feels her thyroid symptoms are worsening     - felt better on 64mcg    Review of Systems   Review of Systems   Constitutional:  Negative for chills and fever.   Cardiovascular:  Negative for chest pain and palpitations.   Endocrine: Positive for heat intolerance. Negative for polydipsia and polyuria.      Medical History  No past medical history on file.    Medications  Current Outpatient Medications   Medication Sig    amLODIPine (NORVASC) 10 MG tablet Take 1 tablet by mouth daily    famotidine (PEPCID) 20 MG tablet Take 1 tablet by mouth 2 times daily    levothyroxine (SYNTHROID) 100 MCG tablet Take 1 tablet by mouth every morning (before breakfast)    pantoprazole (PROTONIX) 40 MG tablet Take 1 tablet by mouth daily     No current facility-administered medications for this visit.       Immunizations   Immunization History   Administered Date(s) Administered    Influenza Virus Vaccine 07/13/2014, 06/10/2019, 05/26/2020, 06/17/2021    Influenza, FLUARIX, FLULAVAL, FLUZONE (age 86 mo+) AND AFLURIA, (age 65 y+), PF, 0.60mL 06/17/2021    Influenza, FLUCELVAX, (age 61 mo+), MDCK, PF, 0.1mL 07/18/2022    TDaP, ADACEL (age 26y-64y), Durwin Reges (age 10y+), IM, 0.81mL 07/23/2019       Allergies   No Known Allergies    Objective   Vital Signs  BP 133/78 (Site: Left Upper Arm, Position: Sitting, Cuff Size: Medium Adult)   Pulse 99   Temp 98.1 F (36.7 C) (Oral)   Resp 18   Ht 1.6 m  (5\' 3" )   Wt 84.8 kg (187 lb)   LMP 07/10/2022 (Exact Date)   SpO2 99%   BMI 33.13 kg/m     Physical Examination  Physical Exam  Vitals and nursing note reviewed.   Constitutional:       General: She is not in acute distress.     Appearance: Normal appearance.   Cardiovascular:      Rate and Rhythm: Normal rate and regular rhythm.      Pulses: Normal pulses.      Heart sounds: Normal heart sounds.   Pulmonary:      Effort: Pulmonary effort is normal. No respiratory distress.      Breath sounds: Normal breath sounds.   Musculoskeletal:      Right lower leg: No edema.      Left lower leg: No edema.   Neurological:      Mental Status: She is alert.          Assessment and Plan   Rebecca Lyons is a 36 y.o. female who presents for Hypothyroidism (Follow up and labs prior to more refills.)    1. Acquired hypothyroidism  Rechecking levels today. Will defer to reproductive endo for dosing and management going forward.  - TSH + Free T4 Panel; Future  2. Encounter for immunization  - Influenza, FLUCELVAX, (age 76 mo+), IM, Preservative Free, 0.5 mL     No follow-ups on file.    Pt was discussed with Dr. Su Hilt (attending physician).    I have reviewed patient medical and social history and medications.  I have reviewed pertinent labs results and other data. I have discussed the diagnosis with the patient and the intended plan as seen in the above orders. The patient has received an after-visit summary and questions were answered concerning future plans. I have discussed medication side effects and warnings with the patient as well.    Laymond Purser, MD  Resident, [facility]  07/18/22

## 2022-07-18 NOTE — Progress Notes (Signed)
Patient has been identified by name and DOB.    Chief Complaint   Patient presents with    Hypothyroidism     Follow up and labs prior to more refills.       Vitals:    07/18/22 1345   BP: 133/78   Site: Left Upper Arm   Position: Sitting   Cuff Size: Medium Adult   Pulse: 99   Resp: 18   Temp: 98.1 F (36.7 C)   TempSrc: Oral   SpO2: 99%   Weight: 84.8 kg (187 lb)   Height: 1.6 m (5\' 3" )        1. Have you been to the ER, urgent care clinic since your last visit?  Hospitalized since your last visit?No    2. Have you seen or consulted any other health care providers outside of the [facility] since your last visit?  Include any pap smears or colon screening. No

## 2022-07-19 LAB — TSH + FREE T4 PANEL
T4 Free: 1.4 NG/DL (ref 0.8–1.5)
TSH, 3RD GENERATION: 3.73 u[IU]/mL (ref 0.36–3.74)

## 2022-07-21 NOTE — Progress Notes (Signed)
I discussed the findings, assessment and plan with the resident and agree with the resident's findings and plan as documented in the resident's note.

## 2022-07-24 NOTE — Progress Notes (Signed)
Formatting of this note might be different from the original.  Patient ID: Rebecca Lyons is a 36 y.o. female.    Procedures    Intrauterine Insemination Procedure Note    Cycle # 1 Insemination    Date of Procedure: 07/24/2022    Attending Staff  [name], MD    Assistants, Nurses  Emilia Beck    Diagnosis  Unexplained Infertility    Procedure  Intrauterine Insemination    Consent for Procedure  Obtained    Description of Procedure  After the patient verified that the sperm sample was correctly labelled, the patient was place on the gynecology table in lithotomy position.  A speculum was advanced into the vagina and the cervical mucous removed with a swab.  The sample was drawn up into a Unisem catheter.  The catheter was advanced through the cervical canal and into the uterine cavity.  The sample was gently injected into the uterine cavity.  The speculum was removed from the vagina.  The patient tolerated the procedure well.    Notes  Speculum required: [name]  Uterine position: 30 degree AV curve  Total Motile Sperm Inseminated: 2.2 X106     Anesthesia  None    [name], MD     Electronically signed by [name], MD at 07/24/2022 12:56 PM EST

## 2022-08-17 NOTE — Addendum Note (Signed)
Addended by: [name] on: 08/17/2022 03:18 PM     Modules accepted: Orders      Electronically signed by [name], MD at 08/17/2022  3:18 PM EST

## 2022-08-17 NOTE — Progress Notes (Signed)
Formatting of this note might be different from the original.  Chief Complaint: No menses    OPK positive on 07/24/22    transvaginal ultrasound  Em 14.6 mm  CL on the right ovary    A/P  Check P and hCG      Electronically signed by [name], MD at 08/17/2022  9:52 AM EST

## 2022-08-17 NOTE — Addendum Note (Signed)
Addended by: [name] on: 08/18/2022 12:44 PM     Modules accepted: Orders      Electronically signed by [name], MD at 08/18/2022 12:44 PM EST

## 2022-08-21 ENCOUNTER — Encounter

## 2022-08-29 ENCOUNTER — Ambulatory Visit
Admit: 2022-08-29 | Discharge: 2022-08-29 | Payer: PRIVATE HEALTH INSURANCE | Primary: Student in an Organized Health Care Education/Training Program

## 2022-08-29 DIAGNOSIS — B9689 Other specified bacterial agents as the cause of diseases classified elsewhere: Secondary | ICD-10-CM

## 2022-08-29 MED ORDER — LEVOFLOXACIN 750 MG PO TABS
750 MG | ORAL_TABLET | Freq: Every day | ORAL | 0 refills | Status: AC
Start: 2022-08-29 — End: 2022-09-05

## 2022-08-29 MED ORDER — BENZONATATE 200 MG PO CAPS
200 MG | ORAL_CAPSULE | Freq: Three times a day (TID) | ORAL | 0 refills | Status: AC | PRN
Start: 2022-08-29 — End: 2022-09-05

## 2022-08-29 NOTE — Progress Notes (Signed)
Identified pt with two pt identifiers(name and DOB). Reviewed record in preparation for visit and have obtained necessary documentation.  Chief Complaint   Patient presents with    Cough     X 6 days, over a week ago had symptoms of sore throat and sinus pressure, fever over the weekend, patient feels its getting worst. Productive yellow mucus x 2 days        Health Maintenance Due   Topic    Hepatitis B vaccine (1 of 3 - 3-dose series)    COVID-19 Vaccine (1)    Varicella vaccine (1 of 2 - 2-dose childhood series)    HIV screen     Hepatitis C screen        Vitals:    08/29/22 0806 08/29/22 0812   BP: (!) 142/94 (!) 151/103   Site: Left Upper Arm Right Upper Arm   Position: Sitting Sitting   Cuff Size: Medium Adult Medium Adult   Pulse: (!) 113    Resp: 18    Temp: 98.9 F (37.2 C)    TempSrc: Oral    SpO2: 98%    Weight: 86.4 kg (190 lb 7.6 oz)    Height: 1.6 m (5\' 3" )            Coordination of Care Questionnaire:  :   1. Have you been to the ER, urgent care clinic since your last visit?  Hospitalized since your last visit?Yes When: August 13, 2022 Where: [facility] Reason for visit: sore throat    2. Have you seen or consulted any other health care providers outside of the [facility] since your last visit?  Include any pap smears or colon screening. No    This patient is accompanied in the office by her self.  I have received verbal consent from Nicholes Rough to discuss any/all medical information while they are present in the room.

## 2022-08-29 NOTE — Progress Notes (Unsigned)
[facility]  [address]  [place], [place] [address]   Office [phone], Fax [phone]      Chief Complaint:     Chief Complaint   Patient presents with    Cough     X 6 days, over a week ago had symptoms of sore throat and sinus pressure, fever over the weekend, patient feels its getting worst. Productive yellow mucus x 2 days       SUBJECTIVE  Rebecca Lyons is an 36 y.o. female who presents for persisting cough    #cough  - sore throat, cough, nasal congestion, rhinorrhea over Thanksgiving  - Dec 3rd went to [facility]. She was negative for Flu, COVID, strep at that time  - completed x1 week of amoxicillin  - afrin, tylenol, ibuprofen prn for symptoms  - sore throat, rhinorrhea improved, but her cough and nasal congestion have persisted  - now since Wednesday, cough worsened with new production and fever (T-max: 100) over the weekend  - nausea, vomiting, abdominal pain  - no known sick contacts      Allergies   No Known Allergies      Medications   Current Outpatient Medications   Medication Sig    levoFLOXacin (LEVAQUIN) 750 MG tablet Take 1 tablet by mouth daily for 7 days    benzonatate (TESSALON) 200 MG capsule Take 1 capsule by mouth 3 times daily as needed for Cough    amLODIPine (NORVASC) 10 MG tablet Take 1 tablet by mouth daily    famotidine (PEPCID) 20 MG tablet Take 1 tablet by mouth 2 times daily    levothyroxine (SYNTHROID) 100 MCG tablet Take 1 tablet by mouth every morning (before breakfast)    pantoprazole (PROTONIX) 40 MG tablet Take 1 tablet by mouth daily     No current facility-administered medications for this visit.         Past surgical, medical and social history reviewed and updated as relevant to presenting concerns.     ROS: See HPI      OBJECTIVE  BP (!) 151/103 (Site: Right Upper Arm, Position: Sitting, Cuff Size: Medium Adult)   Pulse (!) 113   Temp 98.9 F (37.2 C) (Oral)   Resp 18   Ht 1.6 m (5\' 3" )   Wt 86.4 kg (190 lb 7.6 oz)   LMP  08/08/2022   SpO2 98%   BMI 33.74 kg/m     Physical Exam  General appearance - alert, uncomfortable appearing, and in no distress  Eyes -  sclera anicteric  Ears - bilateral TM's and external ear canals normal  Nose - normal and patent, no erythema, discharge or polyps; frontal and maxillary sinus tenderness  Mouth - mucous membranes moist, mild oropharyngeal cobblestoning with post-nasal drip  Neck - supple, no significant adenopathy  Chest - clear to auscultation, no wheezes, rales or rhonchi, symmetric air entry  Heart - normal rate, regular rhythm, normal S1, S2, no murmurs, rubs, clicks or gallops  Neurological - alert, oriented, normal speech, no focal findings or movement disorder noted  Extremities - peripheral pulses normal, no pedal edema, no clubbing or cyanosis  Skin - normal coloration and turgor, no rashes, no suspicious skin lesions noted      ASSESSMENT & PLAN  1. Acute bacterial rhinosinusitis: Favor bacterial  - levoFLOXacin (LEVAQUIN) 750 MG tablet; Take 1 tablet by mouth daily for 7 days  Dispense: 7 tablet; Refill: 0  - benzonatate (TESSALON) 200 MG  capsule; Take 1 capsule by mouth 3 times daily as needed for Cough  Dispense: 21 capsule; Refill: 0        Return in about 2 weeks (around 09/12/2022) for URI/cough follow up.      I have discussed the diagnosis with the patient and the intended plan as seen in the above orders. Patient verbalized understanding of the plan and agrees with the plan. The patient has received an after-visit summary and questions were answered concerning future plans.  I have discussed medication side effects and warnings with the patient as well. Informed patient to return to the office if new symptoms arise.        Darrick Huntsman, MD

## 2022-08-29 NOTE — Patient Instructions (Signed)
-   try the prescribed tessalon for cough  - take the prescribed antibiotic once daily for x7 days  - monitor for any worsening symptoms, fever greater than 100.10F that does not improve with tylenol, or new shortness of breath

## 2022-08-31 NOTE — Progress Notes (Signed)
[facility] Attending Attestation: While the patient was in clinic or immediately following the patient leaving the clinic, I reviewed the patient's medical history, the resident's findings on physical examination, and the patient's diagnosis and treatment plan with the resident and agree with the documentation in the note.     Odean Fester, MD

## 2022-09-01 NOTE — Progress Notes (Signed)
Formatting of this note might be different from the original.  Patient ID: Rebecca Lyons is a 36 y.o. female.    Procedures    Intrauterine Insemination Procedure Note    Cycle # 2 Insemination    Date of Procedure: 09/01/2022    Attending Staff  [name], MD    Assistants, Nurses  None    Diagnosis  Unexplained Infertility    Procedure  Intrauterine Insemination    Consent for Procedure  Obtained    Description of Procedure  After the patient verified that the sperm sample was correctly labelled, the patient was place on the gynecology table in lithotomy position.  A speculum was advanced into the vagina and the cervical mucous removed with a swab.  The sample was drawn up into a Unisem catheter.  The catheter was advanced through the cervical canal and into the uterine cavity.  The sample was gently injected into the uterine cavity.  The speculum was removed from the vagina.  The patient tolerated the procedure well.    Notes  Speculum required: [name]  Uterine position: 30 degree AV curve  Total Motile Sperm Inseminated: 4.3 X106     Anesthesia  None    [name], MD     Electronically signed by [name], MD at 09/01/2022 11:00 AM EST

## 2022-09-01 NOTE — Progress Notes (Signed)
I reviewed with the resident the medical history and the resident's findings on the physical examination.  I discussed with the resident the patient's diagnosis and concur with the plan.

## 2022-09-18 NOTE — Telephone Encounter (Signed)
Formatting of this note might be different from the original.  clom  Electronically signed by Higinio Roger, LPN at 35/45/6256  3:89 AM EST

## 2022-09-28 ENCOUNTER — Ambulatory Visit
Admit: 2022-09-28 | Discharge: 2022-09-28 | Payer: PRIVATE HEALTH INSURANCE | Attending: Specialist | Primary: Student in an Organized Health Care Education/Training Program

## 2022-09-28 ENCOUNTER — Ambulatory Visit
Payer: PRIVATE HEALTH INSURANCE | Attending: Student in an Organized Health Care Education/Training Program | Primary: Student in an Organized Health Care Education/Training Program

## 2022-09-28 DIAGNOSIS — G4733 Obstructive sleep apnea (adult) (pediatric): Secondary | ICD-10-CM

## 2022-09-28 NOTE — Progress Notes (Signed)
[facility] - [id number]  [place]  [place] [address]  Dept: [phone]           [address] [place] [address]  Tel.  [phone]  [place] [address]  Tel.  [phone]  Fax. [phone] [place] [address]  Tel.  [phone]  Fax. [phone]       Chief Complaint       Chief Complaint   Patient presents with    Sleep Problem     NP ref'd by Lowell Guitar, MD for snoring        HPI      Rebecca Lyons is 37 y.o. female seen for evaluation of a sleep disorder.     The patient reports she has experienced snoring that is been increasing in prominence during the past 6 months.  Has gained some weight during this time..      Denies vivid dreams or nightmares, sleep talking or sleepwalking, bruxism or nocturnal incontinence,    Has a variable sleep schedule.  Will often retire at 10 PM and gets out of bed at 5: 30-7 AM.      The patient has not undergone diagnostic testing for the current problems.             09/28/2022    10:00 AM 09/25/2022     2:41 PM 07/29/2021    12:36 PM   Sleep Medicine   Sitting and reading  1 1   Watching TV  1 0   Sitting, inactive in a public place (e.g. a theatre or a meeting)  1 0   As a passenger in a car for an hour without a break  2 1   Lying down to rest in the afternoon when circumstances permit  1 1   Sitting and talking to someone  0 0   Sitting quietly after a lunch without alcohol  0 0   In a car, while stopped for a few minutes in traffic  0 0   Epworth Sleepiness Score  6 3   Neck circumference (Inches) 16          No Known Allergies      Current Outpatient Medications:     amLODIPine (NORVASC) 10 MG tablet, Take 1 tablet by mouth daily, Disp: 90 tablet, Rfl: 3    famotidine (PEPCID) 20 MG tablet, Take 1 tablet by mouth 2 times daily, Disp: 180 tablet, Rfl: 1    levothyroxine (SYNTHROID) 100 MCG tablet, Take 1  tablet by mouth every morning (before breakfast), Disp: 90 tablet, Rfl: 1    pantoprazole (PROTONIX) 40 MG tablet, Take 1 tablet by mouth daily, Disp: 90 tablet, Rfl: 1     She  has a past medical history of Hypothyroidism.    She  has no past surgical history on file.    She family history includes Diabetes in her father; High Blood Pressure in her father; Stroke in her paternal grandfather; Thyroid Disease in her mother.    She  reports that she has never smoked. She has never used smokeless tobacco. She reports that she does not drink alcohol and does not use drugs.     Review of Systems:  Review of Systems        Objective:   BP (!) 139/99 (Site: Left Upper Arm, Position:  Sitting, Cuff Size: Medium Adult)   Pulse (!) 101   Ht 1.6 m (5\' 3" )   Wt 87.1 kg (192 lb 1.6 oz)   LMP 08/08/2022   SpO2 100%   BMI 34.03 kg/m   Body mass index is 34.03 kg/m.    General:   Conversant, cooperative       Oropharynx:   Mallampati score III,  tongue scalloped, narrow posterior oral airway       Neck:   No carotid bruits;     Chest/Lungs:  Clear on auscultation    CVS:  Normal rate, regular rhythm   Skin:  Warm to touch; no obvious rashes   Neuro:  Speech fluent, face symmetrical, tongue movement normal   Psych:  Normal affect,  normal countenance        Assessment:   1. OSA (obstructive sleep apnea)  -     Home Sleep Study; Future     History consistent with sleep disordered breathing.  Patient has a long soft palate, narrow posterior oral airway, scalloped tongue.  Sleep breathing abnormalities may be more prominent when supine, and/or in REM sleep.      Plan:     * Patient has a history and examination consistent with the diagnosis of sleep apnea.  *Home sleep testing was ordered for initial evaluation.    * She was provided information on sleep apnea including corresponding risk factors and the importance of proper treatment.   * Treatment options if indicated were reviewed today.      Instructions:  The patient would  benefit from weight reduction measures.  Do not engage in activities requiring a normal degree of alertness if fatigue is present.  The patient understands that untreated or undertreated sleep apnea could impair judgement and the ability to function normally during the day.  Call or return if symptoms worsen or persist.          Lenna Gilford, MD, FAASM  Electronically signed 09/28/22       This note was created using voice recognition software. Despite editing, there may be syntax errors.  This note will not be viewable in [place].

## 2022-10-06 NOTE — Progress Notes (Signed)
Formatting of this note might be different from the original.  Patient ID: Rebecca Lyons is a 37 y.o. female.    Procedures    Intrauterine Insemination Procedure Note    Cycle # 3 Insemination    Date of Procedure: 10/06/2022    Attending Staff  [name], MD    Assistants, Nurses  None    Diagnosis  Unexplained Infertility    Procedure  Intrauterine Insemination    Consent for Procedure  Obtained    Description of Procedure  After the patient verified that the sperm sample was correctly labelled, the patient was place on the gynecology table in lithotomy position.  A speculum was advanced into the vagina and the cervical mucous removed with a swab.  The sample was drawn up into a Unisem catheter.  The catheter was advanced through the cervical canal and into the uterine cavity.  The sample was gently injected into the uterine cavity.  The speculum was removed from the vagina.  The patient tolerated the procedure well.    Notes  Speculum required: [name]  Uterine position: 60 degree RV curve  Total Motile Sperm Inseminated: 3.24 X106     Anesthesia  None    [name], MD   Electronically signed by [name], MD at 10/06/2022 11:55 AM EST

## 2022-10-09 NOTE — Telephone Encounter (Signed)
LVM for patient that we were canceling her appointment to pick up a HST due to not being in network with her insurance fo a watchpat and that we will be sending the order to [facility] and they will be reaching out to her.

## 2022-10-09 NOTE — Progress Notes (Signed)
HST order faxed to [facility]

## 2022-10-11 ENCOUNTER — Ambulatory Visit: Payer: PRIVATE HEALTH INSURANCE | Primary: Student in an Organized Health Care Education/Training Program

## 2022-11-03 ENCOUNTER — Ambulatory Visit
Admit: 2022-11-03 | Discharge: 2022-11-03 | Payer: PRIVATE HEALTH INSURANCE | Attending: Student in an Organized Health Care Education/Training Program | Primary: Student in an Organized Health Care Education/Training Program

## 2022-11-03 DIAGNOSIS — S93412A Sprain of calcaneofibular ligament of left ankle, initial encounter: Secondary | ICD-10-CM

## 2022-11-03 NOTE — Progress Notes (Unsigned)
Identified pt with two pt identifiers(name and DOB). Reviewed record in preparation for visit and have obtained necessary documentation.  Chief Complaint   Patient presents with    Ankle Pain     Left ankle x 3 weeks, swelling         Vitals:    11/03/22 1109   BP: (!) 140/87   Pulse: (!) 110   Temp: 98 F (36.7 C)   TempSrc: Oral   SpO2: 98%   Weight: 88.5 kg (195 lb)         Coordination of Care Questionnaire:  :     "Have you been to the ER, urgent care clinic since your last visit?  Hospitalized since your last visit?"    NO    "Have you seen or consulted any other health care providers outside of [facility] since your last visit?"    NO

## 2022-11-03 NOTE — Progress Notes (Unsigned)
[facility]  [facility]    Subjective   Rebecca Lyons is a 37 y.o. female who presents for Ankle Pain (Left ankle x 3 weeks, swelling )    L ankle pain  - started about 3 weeks ago when she started going to gym  - went to urgent care, has been taking Voltaren without relief  - no injuries or trauma  - pain is located is inferior to lateral malleolus  - did not improve with rest  - worst with going upstairs    Review of Systems   Review of Systems   Musculoskeletal:  Positive for arthralgias, gait problem and joint swelling. Negative for myalgias.        Medical History  Past Medical History:   Diagnosis Date    Hypothyroidism        Medications  Current Outpatient Medications   Medication Sig    amLODIPine (NORVASC) 10 MG tablet Take 1 tablet by mouth daily    famotidine (PEPCID) 20 MG tablet Take 1 tablet by mouth 2 times daily    levothyroxine (SYNTHROID) 100 MCG tablet Take 1 tablet by mouth every morning (before breakfast)    pantoprazole (PROTONIX) 40 MG tablet Take 1 tablet by mouth daily     No current facility-administered medications for this visit.       Immunizations   Immunization History   Administered Date(s) Administered    Influenza Virus Vaccine 07/13/2014, 06/10/2019, 05/26/2020, 06/17/2021    Influenza, FLUARIX, FLULAVAL, FLUZONE (age 36 mo+) AND AFLURIA, (age 744 y+), PF, 0.62mL 06/17/2021    Influenza, FLUCELVAX, (age 36 mo+), MDCK, PF, 0.70mL 07/18/2022    TDaP, ADACEL (age 34y-64y), Leda Min (age 10y+), IM, 0.56mL 07/23/2019       Allergies   No Known Allergies    Objective   Vital Signs  BP (!) 140/87   Pulse (!) 110   Temp 98 F (36.7 C) (Oral)   Wt 88.5 kg (195 lb)   SpO2 98%   BMI 34.54 kg/m     Physical Examination  Physical Exam   Ankle/Foot: Left  Ankle Effusion: None  Great Toe MTP (normal/valgus/hallux rigidis/varus): Normal    ROM:  Plantarflexion: wnl  Dorsiflexion: wnl  First MTP Joint: wnl  Inversion: wnl  Eversion:  wnl    Palpation:  ATFL tenderness: Present  CFL tenderness: Present  PTFL tenderness: None  Deltoid tenderness: None  Achilles insertion tenderness: None   Achilles tendon tenderness: None   Great Toe IP joint tenderness: None  Great Toe MTP join tenderness t: None  Lisfranc Joint tenderness: None  Medial Malleolus tenderness: None  Lateral Malleolus tenderness: None  5th Metatarsal tenderness: None  Metatarsals tenderness: None  Navicular tenderness: None  Plantar fascia heel tenderness: None    Instability:  Anterior Drawer: Normal  Talar Tilt: Normal  Syndesmosis Tenderness: None  External Rotation Test: Normal  Squeeze Test: Normal  Peroneal Subluxation: Normal    Strength (0-5/5):   Plantarflexion: 5/5  Dorsiflexion: 5/5  Inversion: 5/5  Eversion: 5/5  FHL: 5/5  EHL: 5/5     Assessment and Plan   Rebecca Lyons is a 36 y.o. female who presents for Ankle Pain (Left ankle x 3 weeks, swelling )      1. Sprain of calcaneofibular ligament of left ankle, initial encounter  Hx and exam most consistent with ankle sprain, likely from recently increased activity. No signs of tear, no exam findings that indicate need  for imaging. Counseled on symptomatic mgmt and ROM exercises.    Return in about 4 weeks (around 12/01/2022), or if symptoms worsen or fail to improve.    Pt was discussed with Dr. Su Hilt (attending physician).    I have reviewed patient medical and social history and medications.  I have reviewed pertinent labs results and other data. I have discussed the diagnosis with the patient and the intended plan as seen in the above orders. The patient has received an after-visit summary and questions were answered concerning future plans. I have discussed medication side effects and warnings with the patient as well.    Laymond Purser, MD  Resident, [facility]  11/06/22

## 2022-11-03 NOTE — Progress Notes (Incomplete)
Rebecca Lyons (DOB:  Jul 09, 1986) is a 37 y.o. female,{New vs Established:[id number]::"Established patient"}, here for evaluation of the following chief complaint(s):  Ankle Pain (Left ankle x 3 weeks, swelling )         ASSESSMENT/PLAN:  {There are no diagnoses linked to this encounter. (Refresh or delete this SmartLink)}    No follow-ups on file.         Subjective   SUBJECTIVE/OBJECTIVE:  HPI  Saw urgent care for left ankle pain and swelling, no XR available and advised to seek imaging at local facility.    Review of Systems       Objective   Physical Exam       {Time Documentation Optional:210461321}      An electronic signature was used to authenticate this note.    --Robyn Haber

## 2022-11-09 NOTE — Progress Notes (Signed)
I discussed the findings, assessment and plan with the resident and agree with the resident's findings and plan as documented in the resident's note.

## 2022-11-14 NOTE — Telephone Encounter (Signed)
[facility] Home Sleep Testing Results received and scanned in media for providers review.

## 2022-12-01 ENCOUNTER — Telehealth

## 2022-12-01 NOTE — Telephone Encounter (Signed)
[place] sleep study performed for potential sleep disordered breathing.    Data from a single night recording is presented.  2 hours 59 minutes total monitoring time.  AHI 6.3/h.  (3% desaturation definition).  AHI 4.3/h (4% desaturation definition).  Minimal SaO2 90%.    Impression: Borderline sleep disordered breathing using 3% desaturation definition.  Study potentially underestimated severity of sleep disordered breathing.  Accurate study would allow for appropriate therapy to be initiated.    Sleep technologist: Please review study results with the patient.  Order has been entered for attended PSG.

## 2023-01-08 MED ORDER — FAMOTIDINE 20 MG PO TABS
20 MG | ORAL_TABLET | Freq: Two times a day (BID) | ORAL | 1 refills | Status: DC
Start: 2023-01-08 — End: 2023-06-27

## 2023-01-08 MED ORDER — AMLODIPINE BESYLATE 10 MG PO TABS
10 MG | ORAL_TABLET | Freq: Every day | ORAL | 3 refills | Status: DC
Start: 2023-01-08 — End: 2023-06-27

## 2023-01-08 MED ORDER — LEVOTHYROXINE SODIUM 100 MCG PO TABS
100 MCG | ORAL_TABLET | Freq: Every day | ORAL | 1 refills | Status: DC
Start: 2023-01-08 — End: 2023-06-27

## 2023-01-08 MED ORDER — PANTOPRAZOLE SODIUM 40 MG PO TBEC
40 MG | ORAL_TABLET | Freq: Every day | ORAL | 1 refills | Status: DC
Start: 2023-01-08 — End: 2023-06-27

## 2023-06-28 NOTE — Telephone Encounter (Signed)
Medication Refill Request    Rebecca Lyons is requesting a refill of the following medication(s):   Requested Prescriptions     Pending Prescriptions Disp Refills    amLODIPine (NORVASC) 10 MG tablet 90 tablet 3     Sig: Take 1 tablet by mouth daily    famotidine (PEPCID) 20 MG tablet 180 tablet 1     Sig: Take 1 tablet by mouth 2 times daily    levothyroxine (SYNTHROID) 100 MCG tablet 90 tablet 1     Sig: Take 1 tablet by mouth every morning (before breakfast)    pantoprazole (PROTONIX) 40 MG tablet 90 tablet 1     Sig: Take 1 tablet by mouth daily     Lab Results   Component Value Date    TSH 3.73 07/18/2022    T4FREE 1.4 07/18/2022     Lab Results   Component Value Date    WBC 4.4 10/31/2021    HGB 11.9 10/31/2021    HCT 37.0 10/31/2021    MCV 80.6 10/31/2021    PLT 283 10/31/2021     Lab Results   Component Value Date    NA 140 10/31/2021    K 3.9 10/31/2021    CL 108 10/31/2021    CO2 24 10/31/2021    BUN 13 10/31/2021    CREATININE 0.84 10/31/2021    GLUCOSE 102 (H) 10/31/2021    CALCIUM 10.0 10/31/2021    BILITOT 0.3 10/31/2021    ALKPHOS 49 10/31/2021    AST 15 10/31/2021    ALT 21 10/31/2021    LABGLOM >60 10/31/2021    AGRATIO 1.3 10/31/2021    GLOB 3.3 10/31/2021       Listed PCP is Lenor Coffin, MD   Last provider to prescribe medication: Dr. Laymond Purser on 01/08/23  Date of Last Office Visit at [facility]: 11/03/22 with Dr. Laymond Purser    FUTURE APPOINTMENT: No future appointments.    Please send refill to:    [facility] [place], [place] - [address] - [place] [phone] Carmon Ginsberg [phone]  [address] [place] [address]  Phone: [phone] Fax: [phone]      Please review request and approve or deny with recommendations within 48 hours.

## 2023-06-29 MED ORDER — FAMOTIDINE 20 MG PO TABS
20 MG | ORAL_TABLET | Freq: Two times a day (BID) | ORAL | 0 refills | Status: AC
Start: 2023-06-29 — End: 2023-07-31

## 2023-06-29 MED ORDER — LEVOTHYROXINE SODIUM 100 MCG PO TABS
100 MCG | ORAL_TABLET | Freq: Every day | ORAL | 1 refills | Status: AC
Start: 2023-06-29 — End: 2023-07-31

## 2023-06-29 MED ORDER — PANTOPRAZOLE SODIUM 40 MG PO TBEC
40 MG | ORAL_TABLET | Freq: Every day | ORAL | 0 refills | Status: AC
Start: 2023-06-29 — End: 2023-07-31

## 2023-06-29 MED ORDER — AMLODIPINE BESYLATE 10 MG PO TABS
10 MG | ORAL_TABLET | Freq: Every day | ORAL | 0 refills | Status: AC
Start: 2023-06-29 — End: 2023-07-29

## 2023-07-31 ENCOUNTER — Encounter: Admit: 2023-07-31 | Admitting: Student in an Organized Health Care Education/Training Program

## 2023-07-31 ENCOUNTER — Ambulatory Visit
Admit: 2023-07-31 | Discharge: 2023-07-31 | Payer: PRIVATE HEALTH INSURANCE | Attending: Student in an Organized Health Care Education/Training Program | Admitting: Student in an Organized Health Care Education/Training Program | Primary: Student in an Organized Health Care Education/Training Program

## 2023-07-31 VITALS — BP 131/88 | HR 103 | Temp 98.50000°F | Ht 62.99 in | Wt 196.8 lb

## 2023-07-31 DIAGNOSIS — Z0000 Encounter for general adult medical examination without abnormal findings: Principal | ICD-10-CM

## 2023-07-31 LAB — CORTISOL AM, TOTAL: Cortisol - AM: 13.4 ug/dL (ref 4.30–22.45)

## 2023-07-31 LAB — LIPID PANEL
Chol/HDL Ratio: 2.2 (ref 0.0–5.0)
Cholesterol, Total: 212 mg/dL — ABNORMAL HIGH (ref <200)
HDL: 97 mg/dL
LDL Cholesterol: 82.8 mg/dL (ref 0–100)
Triglycerides: 161 mg/dL — ABNORMAL HIGH (ref <150)
VLDL Cholesterol Calculated: 32.2 mg/dL

## 2023-07-31 LAB — CBC
Hematocrit: 31 % — ABNORMAL LOW (ref 35.0–47.0)
Hemoglobin: 9.1 g/dL — ABNORMAL LOW (ref 11.5–16.0)
MCH: 20.2 pg — ABNORMAL LOW (ref 26.0–34.0)
MCHC: 29.4 g/dL — ABNORMAL LOW (ref 30.0–36.5)
MCV: 68.9 FL — ABNORMAL LOW (ref 80.0–99.0)
MPV: 10.4 FL (ref 8.9–12.9)
Nucleated RBCs: 0 /100{WBCs}
Platelets: 373 10*3/uL (ref 150–400)
RBC: 4.5 M/uL (ref 3.80–5.20)
RDW: 16.9 % — ABNORMAL HIGH (ref 11.5–14.5)
WBC: 6.2 10*3/uL (ref 3.6–11.0)
nRBC: 0 10*3/uL (ref 0.00–0.01)

## 2023-07-31 LAB — HEMOGLOBIN A1C
Estimated Avg Glucose: 105 mg/dL
Hemoglobin A1C: 5.3 % (ref 4.0–5.6)

## 2023-07-31 MED ORDER — LEVOTHYROXINE SODIUM 125 MCG PO TABS
125 | ORAL_TABLET | Freq: Every day | ORAL | 3 refills | Status: DC
Start: 2023-07-31 — End: 2024-06-16

## 2023-07-31 MED ORDER — PANTOPRAZOLE SODIUM 40 MG PO TBEC
40 MG | ORAL_TABLET | Freq: Every day | ORAL | 0 refills | Status: DC
Start: 2023-07-31 — End: 2023-10-06

## 2023-07-31 MED ORDER — AMLODIPINE BESYLATE 10 MG PO TABS
10 MG | ORAL_TABLET | Freq: Every day | ORAL | 0 refills | Status: DC
Start: 2023-07-31 — End: 2023-10-03

## 2023-07-31 MED ORDER — FAMOTIDINE 20 MG PO TABS
20 MG | ORAL_TABLET | Freq: Two times a day (BID) | ORAL | 0 refills | Status: AC
Start: 2023-07-31 — End: 2023-10-06

## 2023-07-31 NOTE — Progress Notes (Signed)
 Rebecca Lyons is a 37 y.o. female      Chief Complaint   Patient presents with    Annual Exam     And medication refills.  Going through IVF at [facility] reproductive medicine Dr. Hildred Priest       "Have you been to the ER, urgent care clinic since your last vis

## 2023-07-31 NOTE — Progress Notes (Signed)
 [facility]      History of Present Illness:     Chief Complaint   Patient presents with    Annual Exam     And medication refills.  Pap smear 06/27/21-negative. Going through IVF at [facility] reproductive medicine Dr. Gerlene Burdock Lucid

## 2023-08-07 ENCOUNTER — Encounter: Admit: 2023-08-07 | Admitting: Student in an Organized Health Care Education/Training Program

## 2023-08-07 DIAGNOSIS — D509 Iron deficiency anemia, unspecified: Secondary | ICD-10-CM

## 2023-08-07 NOTE — Progress Notes (Signed)
Transvaginal ultrasound

## 2023-08-08 MED ORDER — FERROUS SULFATE 325 (65 FE) MG PO TABS
325 | ORAL_TABLET | ORAL | 1 refills | Status: DC
Start: 2023-08-08 — End: 2024-07-10

## 2023-08-08 NOTE — Telephone Encounter (Signed)
 Called and informed patient that Dr. Constance Goltz sent her a MyChart message. Also, informed patient that I was also sending two self scheduling tickets.

## 2023-08-08 NOTE — Telephone Encounter (Signed)
 Diagnoses and all orders for this visit:    Microcytic anemia  -     Iron and TIBC; Future  -     Ferritin; Future  -     Transferrin; Future  -     Path Review, Smear; Future  -     Hemoglobinopathy Evaluation; Future    Other orders  -     ferrous sulfat

## 2023-08-10 ENCOUNTER — Encounter: Payer: PRIVATE HEALTH INSURANCE | Primary: Student in an Organized Health Care Education/Training Program

## 2023-08-13 NOTE — Progress Notes (Signed)
Provider aware of elevated blood pressure and heart rate.  Arnoldo Morale, LPN

## 2023-08-13 NOTE — Progress Notes (Signed)
 Patient ID: Rebecca Lyons is a 37 y.o. female.    Procedures    Embryo Transfer Note    Cycle # 3 Transfer    Date of Procedure: 08/13/2023    Attending Staff  Hildred Priest, MD    Assistants, Nurses  Lenard Forth    Preoperative Diagnosis  Infertility    Po

## 2023-09-06 NOTE — Progress Notes (Signed)
OB Ultrasound, First trimester, Transvaginal    Pregnant following Frozen Embryo Transfer on 08/13/23  [redacted]w[redacted]d by FET date    IUP With cardiac motion  CRL= 0.42 cm, consistent with 6 weeks 1 days    Plan:  Continue PIO and estrace  Schedule NOB appt  RTO in two weeks

## 2023-09-07 NOTE — Telephone Encounter (Signed)
Spoke with pt and scheduled pregnancy confirmation.

## 2023-09-20 NOTE — Progress Notes (Signed)
Patient ID: Rebecca Lyons is a 38 y.o. female.    Procedures    OB Ultrasound, First trimester, Transvaginal    Pregnant following Frozen Embryo Transfer on 08/13/23    I/UP With cardiac motion  CRL= 1.8 cm, consistent with 8 weeks 2 days    Plan:  Continue estrace and PIO  Transvaginal ultrasound in 2 weeks

## 2023-10-03 ENCOUNTER — Ambulatory Visit
Admit: 2023-10-03 | Discharge: 2023-10-03 | Payer: PRIVATE HEALTH INSURANCE | Attending: Student in an Organized Health Care Education/Training Program | Primary: Student in an Organized Health Care Education/Training Program

## 2023-10-03 VITALS — BP 120/76 | HR 111 | Temp 98.50000°F | Ht 62.99 in | Wt 200.0 lb

## 2023-10-03 DIAGNOSIS — R1030 Lower abdominal pain, unspecified: Secondary | ICD-10-CM

## 2023-10-03 LAB — AMB POC URINALYSIS DIP STICK AUTO W/O MICRO
Bilirubin, Urine, POC: NEGATIVE
Blood, Urine, POC: NEGATIVE
Glucose, Urine, POC: NEGATIVE
Ketones, Urine, POC: NEGATIVE
Leukocyte Esterase, Urine, POC: NEGATIVE
Nitrite, Urine, POC: NEGATIVE
Protein, Urine, POC: NEGATIVE
Specific Gravity, Urine, POC: 1.01 (ref 1.001–1.035)
Urobilinogen, POC: 0.2
pH, Urine, POC: 6 (ref 4.6–8.0)

## 2023-10-03 MED ORDER — NIFEDIPINE ER OSMOTIC RELEASE 30 MG PO TB24
30 | ORAL_TABLET | Freq: Every day | ORAL | 1 refills | Status: DC
Start: 2023-10-03 — End: 2024-06-16

## 2023-10-03 MED ORDER — ASPIRIN 81 MG PO TBEC
81 | ORAL_TABLET | Freq: Every day | ORAL | 1 refills | 30.00000 days | Status: DC
Start: 2023-10-03 — End: 2024-07-10

## 2023-10-03 NOTE — Progress Notes (Signed)
Rebecca Lyons is a 38 y.o. female      Chief Complaint   Patient presents with    Follow-up     For anemia, [redacted] wks pregnant.  Lower abdominal pain since yesterday.  Has appt with reproductive Dr tomorrow and OB next week       "Have you been to the ER, urgent care clinic since your last visit?  Hospitalized since your last visit?"    NO    "Have you seen or consulted any other health care providers outside of [facility] since your last visit?"    NO            Click Here for Release of Records Request    Vitals:    10/03/23 1404 10/03/23 1416   BP: (!) 144/86 120/76   Site: Right Upper Arm Left Upper Arm   Position: Sitting Sitting   Cuff Size: Large Adult Large Adult   Pulse: (!) 111    Temp: 98.5 F (36.9 C)    TempSrc: Oral    SpO2: 99%    Weight: 90.7 kg (200 lb)    Height: 1.6 m (5' 2.99")            Medication Reconciliation Completed, changes notes. Please Update medication list.

## 2023-10-03 NOTE — Progress Notes (Signed)
Rebecca Lyons      History of Present Illness:     Chief Complaint   Patient presents with    Follow-up     For anemia, [redacted] wks pregnant.  Lower abdominal pain since yesterday.  Has appt with reproductive Dr tomorrow and OB next week       Rebecca Lyons is a 38 y.o. female with a PMH notable for HTN, GERD, hypothyroidism, PCOS, infertility, currently 10w GA s/p IVF at [facility] who presents to follow up on anemia.    #Anemia - labs consistent with IDA. Symptoms improve weakness, fatigue, windedness, occasional dizziness. Taking PO Fe every other day.    #HTN - Well controlled on amlodipine 10mg  daily. No s/s.      PMH (REVIEWED):  Past Medical History:   Diagnosis Date    Hypertension     Hypothyroidism        Current Medications/Allergies (REVIEWED):     Current Outpatient Medications on File Prior to Visit   Medication Sig Dispense Refill    Prenatal Vit-Fe Fumarate-FA (PRENATAL VITAMIN PO) Take by mouth      ferrous sulfate (IRON 325) 325 (65 Fe) MG tablet Take 1 tablet by mouth every other day 90 tablet 1    aspirin 81 MG chewable tablet Take 1 tablet by mouth daily      estradiol (ESTRACE) 2 MG tablet Take 1 tablet by mouth 3 times daily      famotidine (PEPCID) 20 MG tablet Take 1 tablet by mouth 2 times daily 30 tablet 0    pantoprazole (PROTONIX) 40 MG tablet Take 1 tablet by mouth daily 30 tablet 0    levothyroxine (SYNTHROID) 125 MCG tablet Take 1 tablet by mouth Daily 90 tablet 3     No current facility-administered medications on file prior to visit.       No Known Allergies      Review of Systems:     Review of Systems as per HPI    Objective:     Vitals:    10/03/23 1404 10/03/23 1416   BP: (!) 144/86 120/76   Site: Right Upper Arm Left Upper Arm   Position: Sitting Sitting   Cuff Size: Large Adult Large Adult   Pulse: (!) 111    Temp: 98.5 F (36.9 C)    TempSrc: Oral    SpO2: 99%    Weight: 90.7 kg (200 lb)    Height: 1.6 m (5' 2.99")        Physical Exam:   General: NAD, well  appearing  Resp: no increased WOB  Cardiac: color good, appears well perfused  Abdomen: no abdominal tenderness or distention  Extremities: no edema      Recent Labs:  No results found for this or any previous visit (from the past 12 hour(s)).    Assessment and Plan:     Rebecca Lyons was seen today for follow-up.    Diagnoses and all orders for this visit:    Lower abdominal pain  Likely pregnancy related. POC UA neg. No s/s acute abdomen.  -     AMB POC URINALYSIS DIP STICK AUTO W/O MICRO    Iron deficiency anemia, unspecified iron deficiency anemia type  Labs consistent with IDA, refractory to PO Fe. Will further workup based on rather acute onset and failure to improve w tx, and refer for IVI infusions.  -     Amb Referral to Infusion Center  -  Hemoglobinopathy Evaluation; Future  -     Path Review, Smear; Future  -     CBC with Auto Differential; Future  -     CBC with Auto Differential  -     Path Review, Smear  -     Hemoglobinopathy Evaluation    Primary hypertension  Now pregnant, will transition from amlodipine. Counseled to check BP nightly, if BP is poorly controlled, will increase to BID.  -     NIFEdipine (PROCARDIA XL) 30 MG extended release tablet; Take 1 tablet by mouth daily    Acquired hypothyroidism  Trend labs, anticipate increase synthroid requirement in pregnancy.  -     TSH; Future  -     TSH    [redacted] weeks gestation of pregnancy  Start ASA at 12 weeks.  -     aspirin 81 MG EC tablet; Take 1 tablet by mouth daily            Follow up:     Lenor Coffin, MD    We discussed the expected course, resolution and complications of the diagnosis(es) in detail.  Medication risks, benefits, costs, interactions, and alternatives were discussed as indicated.  I advised her to contact the office if her condition worsens, changes or fails to improve as anticipated. She expressed understanding with the diagnosis(es) and plan.

## 2023-10-05 LAB — CBC WITH AUTO DIFFERENTIAL
Basophils %: 0.4 % (ref 0.0–1.0)
Basophils Absolute: 0.03 10*3/uL (ref 0.00–0.10)
Eosinophils %: 2.9 % (ref 0.0–7.0)
Eosinophils Absolute: 0.23 10*3/uL (ref 0.00–0.40)
Hematocrit: 32.3 % — ABNORMAL LOW (ref 35.0–47.0)
Hemoglobin: 9.3 g/dL — ABNORMAL LOW (ref 11.5–16.0)
Immature Granulocytes %: 0.1 % (ref 0.0–0.5)
Immature Granulocytes Absolute: 0.01 10*3/uL (ref 0.00–0.04)
Lymphocytes %: 22.7 % (ref 12.0–49.0)
Lymphocytes Absolute: 1.77 10*3/uL (ref 0.80–3.50)
MCH: 20.6 pg — ABNORMAL LOW (ref 26.0–34.0)
MCHC: 28.8 g/dL — ABNORMAL LOW (ref 30.0–36.5)
MCV: 71.6 fL — ABNORMAL LOW (ref 80.0–99.0)
MPV: 11 fL (ref 8.9–12.9)
Monocytes %: 6.3 % (ref 5.0–13.0)
Monocytes Absolute: 0.49 10*3/uL (ref 0.00–1.00)
Neutrophils %: 67.6 % (ref 32.0–75.0)
Neutrophils Absolute: 5.27 10*3/uL (ref 1.80–8.00)
Nucleated RBCs: 0 /100{WBCs}
Platelets: 370 10*3/uL (ref 150–400)
RBC: 4.51 M/uL (ref 3.80–5.20)
RDW: 17.6 % — ABNORMAL HIGH (ref 11.5–14.5)
WBC: 7.8 10*3/uL (ref 3.6–11.0)
nRBC: 0 10*3/uL (ref 0.00–0.01)

## 2023-10-05 LAB — TSH: TSH, 3rd Generation: 2.38 u[IU]/mL (ref 0.36–3.74)

## 2023-10-05 LAB — PERIPHERAL BLOOD SMEAR, PATH REVIEW

## 2023-10-06 ENCOUNTER — Encounter

## 2023-10-08 LAB — HEMOGLOBINOPATHY EVALUATION
Hemoglobin A/Hemoglobin Total: 98.2 % (ref 96.4–98.8)
Hemoglobin A2: 1.8 % (ref 1.8–3.2)
Hemoglobin F: 0 % (ref 0.0–2.0)
Hemoglobin S: 0 %

## 2023-10-09 NOTE — Telephone Encounter (Signed)
Medication Refill Request    Rebecca Lyons is requesting a refill of the following medication(s):   Requested Prescriptions     Pending Prescriptions Disp Refills    famotidine (PEPCID) 20 MG tablet 30 tablet 0     Sig: Take 1 tablet by mouth 2 times daily    pantoprazole (PROTONIX) 40 MG tablet 30 tablet 0     Sig: Take 1 tablet by mouth daily        Listed PCP is Lenor Coffin, MD   Last provider to prescribe medication: Dr. Constance Goltz on 07/31/23  Date of Last Office Visit at [facility]: 10/03/2023 with Dr. Constance Goltz    FUTURE APPOINTMENT: Visit date not found    Please send refill to:    [facility] [place], [place] - [address] [place] - [place] [phone] - [phone]  [address] [place] [address]  Phone: [phone] Fax: [phone]      Please review request and approve or deny with recommendations within 48 hours.

## 2023-10-10 MED ORDER — FAMOTIDINE 20 MG PO TABS
20 MG | ORAL_TABLET | Freq: Two times a day (BID) | ORAL | 0 refills | Status: DC
Start: 2023-10-10 — End: 2023-10-29

## 2023-10-10 MED ORDER — PANTOPRAZOLE SODIUM 40 MG PO TBEC
40 MG | ORAL_TABLET | Freq: Every day | ORAL | 0 refills | Status: DC
Start: 2023-10-10 — End: 2023-10-29

## 2023-10-23 MED FILL — IRON SUCROSE 20 MG/ML IV SOLN: 20 MG/ML | INTRAVENOUS | Qty: 10

## 2023-10-26 ENCOUNTER — Encounter

## 2023-10-26 ENCOUNTER — Inpatient Hospital Stay
Admit: 2023-10-26 | Discharge: 2023-10-26 | Payer: PRIVATE HEALTH INSURANCE | Primary: Student in an Organized Health Care Education/Training Program

## 2023-10-26 VITALS — BP 128/77 | HR 97 | Temp 98.90000°F | Resp 16 | Ht 63.0 in | Wt 195.1 lb

## 2023-10-26 DIAGNOSIS — D509 Iron deficiency anemia, unspecified: Secondary | ICD-10-CM

## 2023-10-26 MED ORDER — IRON SUCROSE 20 MG/ML IV SOLN
20 | Freq: Once | INTRAVENOUS | Status: AC
Start: 2023-10-26 — End: 2023-10-26
  Administered 2023-10-26: 18:00:00 200 mg via INTRAVENOUS

## 2023-10-26 MED ORDER — SODIUM CHLORIDE 0.9 % IV SOLN
0.9 % | INTRAVENOUS | Status: AC | PRN
Start: 2023-10-26 — End: 2023-10-27
  Administered 2023-10-26: 18:00:00 25 mL/h via INTRAVENOUS

## 2023-10-26 NOTE — Progress Notes (Signed)
 OUTPATIENT INFUSION CENTER     DISCHARGE INSTRUCTIONS FOR:     IRON INFUSIONS - INCLUDING VENOFER, FERRLECIT, AND INFED     You should continue to take your usual home medications unless otherwise instructed by your physician.  Drink plenty of fluids and eat your usual diet.     All medications have the potential to cause side effects.  Your physician can instruct you regarding any necessary treatment for side effects.  Some possible side effects of iron infusions may include the following:                 - Urinary changes;              - Mild muscle cramping;              - Mild nausea, stomach pain, diarrhea or constipation;              - Mild skin itching, mild pain at IV site.                   Signs/Symptoms of an allergic reaction may require immediate medical attention. These may include one or more of the following:         Skin redness, itching, swelling, blistering, weeping, crusting, rash or hives.  Wheezing, chest tightness, cough, or shortness of breath;   Swelling of the face, eyelids, lips, tongue, or throat;  Severe headache, seizures or tremor;  Stuffy nose, runny nose, sneezing;   Red (bloodshot), itchy, swollen, or watery eyes;  Stomach  pain, nausea, vomiting, diarrhea or bloody diarrhea;  Chest pain or tightness, increased heart rate, palpitations, changes in blood pressure which can cause dizziness, unusual feelings of weakness or fatigue;  Back ache or pain around waist;  Painful urination, increase or decrease in amount of urine, blood in urine.        Contact your physician's office with any questions or concerns regarding your treatment.

## 2023-10-26 NOTE — Progress Notes (Signed)
Outpatient Infusion Center Progress Note        Date: 10/26/23    Name: Rebecca Lyons    MRN: [id number]         DOB: 1986-01-19    MD: Lenor Coffin, MD       Rebecca Lyons admitted to [facility] for Day 1/5 of Venofer ambulatory in stable condition. Assessment completed. No new concerns voiced.  24 gauge peripheral IV obtained in the LAC without difficulty, line flushed and capped. No labs ordered for this visit.      ** This is the patient's first time receiving IV iron. She is [redacted] weeks pregnant.        Vitals:    10/26/23 1142 10/26/23 1212 10/26/23 1226 10/26/23 1355   BP:    128/77   Pulse: (!) 114 (!) 102 99 97   Resp:    16   Temp:    98.9 F (37.2 C)   TempSrc:    Temporal   SpO2:    100%   Weight:       Height:                 Medications:  MEDICATIONS GIVEN:  Medications Administered         0.9 % sodium chloride infusion Admin Date  10/26/2023 Action  New Bag Dose  25 mL/hr Rate  25 mL/hr Route  IntraVENous Documented By  Loney Loh, RN        iron sucrose (VENOFER) 200 mg in sodium chloride 0.9 % 100 mL IVPB Admin Date  10/26/2023 Action  New Bag Dose  200 mg Rate  480 mL/hr Route  IntraVENous Documented By  Loney Loh, RN          First time education printed and discussed with patient. Patient verbalized understanding. Opportunity given for patient to ask questions. All questions answered.    Patient remained for 30 minute post infusion observation.          Post-Infusion Vitals:  Vitals:    10/26/23 1355   BP: 128/77   Pulse: 97   Resp: 16   Temp: 98.9 F (37.2 C)   SpO2: 100%           Pt tolerated treatment well, no adverse reactions noted. PIV maintained positive blood return throughout treatment, flushed with positive blood return at conclusion and removed and wrapped in coban per protocol. D/c home ambulatory in no distress. Patient is aware of next appointment on 10/30/2023 @ 0930 at the [facility] [place] location.        Future Appointments:  Future Appointments   Date Time Provider  Department Center   10/30/2023  9:30 AM [facility] MED INF CHAIR 2 [facility]   11/01/2023 10:30 AM [facility] MED INF CHAIR 3 [facility]   11/06/2023  1:00 PM SS MED INF CHAIR 5 [facility]   11/08/2023 10:00 AM SS MED INF CHAIR 6 [facility]

## 2023-10-29 MED ORDER — PANTOPRAZOLE SODIUM 40 MG PO TBEC
40 | ORAL_TABLET | Freq: Every day | ORAL | 0 refills | Status: AC
Start: 2023-10-29 — End: ?

## 2023-10-29 MED ORDER — FAMOTIDINE 20 MG PO TABS
20 | ORAL_TABLET | Freq: Two times a day (BID) | ORAL | 0 refills | Status: AC
Start: 2023-10-29 — End: ?

## 2023-10-29 NOTE — Telephone Encounter (Signed)
 Medication Refill Request    Rebecca Lyons is requesting a refill of the following medication(s):   Requested Prescriptions     Pending Prescriptions Disp Refills    pantoprazole (PROTONIX) 40 MG tablet 90 tablet 0     Sig: Take 1 tablet by mouth daily    famotidine (PEPCID) 20 MG tablet 180 tablet 0     Sig: Take 1 tablet by mouth 2 times daily        Listed PCP is Lenor Coffin, MD   Last provider to prescribe medication: Constance Goltz  Last Date of Medication Prescribed: 10/10/23   Date of Last Office Visit at [facility]: 10/03/23   FUTURE APPOINTMENT:   Future Appointments   Date Time Provider Department Center   10/30/2023  9:30 AM SS MED INF CHAIR 2 [facility]   11/01/2023 10:30 AM [facility] MED INF CHAIR 3 [facility]   11/06/2023  1:00 PM SS MED INF CHAIR 5 [facility]   11/08/2023 10:00 AM [facility] MED INF CHAIR 6 [facility]       Please send refill to:    [facility] [place], [place] - [address] - [place] [phone] - F [phone]  [address] [place] [address]  Phone: [phone] Fax: [phone]      Please review request and approve or deny with recommendations.

## 2023-10-30 ENCOUNTER — Inpatient Hospital Stay
Admit: 2023-10-30 | Discharge: 2023-10-30 | Payer: PRIVATE HEALTH INSURANCE | Primary: Student in an Organized Health Care Education/Training Program

## 2023-10-30 VITALS — BP 143/103 | HR 108 | Temp 98.80000°F | Resp 18

## 2023-10-30 DIAGNOSIS — D509 Iron deficiency anemia, unspecified: Secondary | ICD-10-CM

## 2023-10-30 MED ORDER — IRON SUCROSE 20 MG/ML IV SOLN
20 | Freq: Once | INTRAVENOUS | Status: AC
Start: 2023-10-30 — End: 2023-10-30
  Administered 2023-10-30: 15:00:00 200 mg via INTRAVENOUS

## 2023-10-30 MED ORDER — SODIUM CHLORIDE 0.9 % IV SOLN
0.9 | INTRAVENOUS | Status: DC | PRN
Start: 2023-10-30 — End: 2023-10-31
  Administered 2023-10-30: 15:00:00 25 mL/h via INTRAVENOUS

## 2023-10-30 MED FILL — IRON SUCROSE 20 MG/ML IV SOLN: 20 MG/ML | INTRAVENOUS | Qty: 10

## 2023-10-30 NOTE — Progress Notes (Signed)
[facility] Progress Note    Date: October 30, 2023    Name: Rebecca Lyons    MRN: [id number]         DOB: 1986-08-05    Ms. Dobransky Arrived ambulatory and in no distress for Venofer Infusion.  Assessment was completed, no acute issues at this time, no new complaints voiced.  24 G PIV established to left arm, + blood return. Patient is [redacted] weeks pregnant. Patient arrived to [facility] with elevated BP, states she becomes anxious being in [place]. States she has a very slight headache but that this is not uncommon for her. Patient states she takes BP medication and has PRN BP medication at home if needed, patient states she closely monitors her Bps at home and knows what to watch for.      Ms. Shankar's vitals were reviewed.  Vitals:    10/30/23 0938   Pulse: (!) 102   Resp: 18   Temp: 98.8 F (37.1 C)   SpO2: 100%     Medications:  Medications Administered         0.9 % sodium chloride infusion Admin Date  10/30/2023 Action  New Bag Dose  25 mL/hr Rate  25 mL/hr Route  IntraVENous Documented By  Willy Eddy, RN        iron sucrose (VENOFER) 200 mg in sodium chloride 0.9 % 100 mL IVPB Admin Date  10/30/2023 Action  New Bag Dose  200 mg Rate  480 mL/hr Route  IntraVENous Documented By  Willy Eddy, RN             Ms. Reitan tolerated treatment well and was discharged from Outpatient Infusion Center in stable condition.   PIV flushed & removed. Patient is aware of future appointments.    Future Appointments   Date Time Provider Department Center   11/01/2023 10:30 AM [facility] MED INF CHAIR 3 [facility]   11/06/2023  1:00 PM SS MED INF CHAIR 5 [facility]   11/08/2023 10:00 AM SS MED INF CHAIR 6 [facility]       Willy Eddy, RN  October 30, 2023

## 2023-10-31 MED FILL — VENOFER 20 MG/ML IV SOLN: 20 MG/ML | INTRAVENOUS | Qty: 10

## 2023-10-31 MED FILL — IRON SUCROSE 20 MG/ML IV SOLN: 20 MG/ML | INTRAVENOUS | Qty: 10

## 2023-11-01 ENCOUNTER — Inpatient Hospital Stay
Admit: 2023-11-01 | Discharge: 2023-11-01 | Payer: PRIVATE HEALTH INSURANCE | Primary: Student in an Organized Health Care Education/Training Program

## 2023-11-01 VITALS — BP 126/90 | HR 112 | Temp 97.60000°F | Resp 16 | Ht 63.0 in

## 2023-11-01 DIAGNOSIS — D509 Iron deficiency anemia, unspecified: Secondary | ICD-10-CM

## 2023-11-01 MED ORDER — SODIUM CHLORIDE 0.9 % IV SOLN
0.9 % | INTRAVENOUS | Status: DC | PRN
Start: 2023-11-01 — End: 2023-11-02
  Administered 2023-11-01: 16:00:00 25 mL/h via INTRAVENOUS

## 2023-11-01 MED ORDER — IRON SUCROSE 20 MG/ML IV SOLN
20 | Freq: Once | INTRAVENOUS | Status: AC
Start: 2023-11-01 — End: 2023-11-01
  Administered 2023-11-01: 16:00:00 200 mg via INTRAVENOUS

## 2023-11-01 NOTE — Progress Notes (Signed)
[facility] Progress Note    Date: November 01, 2023    Name: Nadya Hopwood    MRN: [id number]         DOB: 1986-04-21    Ms. Mounger arrived ambulatory and in no distress for #3/5 Venofer Infusion.  Assessment was completed, no acute issues at this time, no new complaints voiced.  24 G PIV established to right arm, + blood return.  Patient states her MD recently adjusted her thyroid medication and this might contribute to her elevated HR.  States she will discuss this with her.    Ms. Hinderliter's vitals were reviewed.  Vitals:    11/01/23 1031 11/01/23 1043 11/01/23 1125   BP: 135/88  (!) 126/90   Pulse: (!) 112 100 (!) 112   Resp: 16  16   Temp: 97.5 F (36.4 C)  97.6 F (36.4 C)   TempSrc: Temporal  Temporal   SpO2: 100%     Height: 1.6 m (5\' 3" )          Medications:  Medications Administered         0.9 % sodium chloride infusion Admin Date  11/01/2023 Action  New Bag Dose  25 mL/hr Rate  25 mL/hr Route  IntraVENous Documented By  Patrick Jupiter, RN        iron sucrose (VENOFER) 200 mg in sodium chloride 0.9 % 100 mL IVPB Admin Date  11/01/2023 Action  New Bag Dose  200 mg Rate  480 mL/hr Route  IntraVENous Documented By  Patrick Jupiter, RN             Ms. Allena Katz tolerated treatment well and was discharged from Outpatient Infusion Center in stable condition.  PIV flushed & removed. She is aware of the next scheduled appointment.      AVS declined    Patrick Jupiter, RN  November 01, 2023    Future Appointments   Date Time Provider Department Center   11/06/2023  1:00 PM SS MED INF CHAIR 5 [facility]   11/08/2023 10:00 AM SS MED INF CHAIR 6 [facility]

## 2023-11-06 ENCOUNTER — Inpatient Hospital Stay
Admit: 2023-11-06 | Payer: PRIVATE HEALTH INSURANCE | Primary: Student in an Organized Health Care Education/Training Program

## 2023-11-06 VITALS — BP 138/86 | HR 100 | Temp 97.90000°F | Resp 17

## 2023-11-06 DIAGNOSIS — D509 Iron deficiency anemia, unspecified: Secondary | ICD-10-CM

## 2023-11-06 MED ORDER — SODIUM CHLORIDE 0.9 % IV SOLN
0.9 % | INTRAVENOUS | Status: AC | PRN
Start: 2023-11-06 — End: 2023-11-07
  Administered 2023-11-06: 19:00:00 25 mL/h via INTRAVENOUS

## 2023-11-06 MED ORDER — IRON SUCROSE 20 MG/ML IV SOLN
20 | Freq: Once | INTRAVENOUS | Status: AC
Start: 2023-11-06 — End: 2023-11-06
  Administered 2023-11-06: 19:00:00 200 mg via INTRAVENOUS

## 2023-11-06 MED FILL — IRON SUCROSE 20 MG/ML IV SOLN: 20 MG/ML | INTRAVENOUS | Qty: 10

## 2023-11-06 NOTE — Progress Notes (Signed)
 Outpatient Infusion Center Short Visit Progress Note    Ms. Rebecca Lyons admitted to [facility] for venofer infusion ambulatory in stable condition. Assessment completed. Patient having heartburn/reflux saw her MD yesterday and got a new prescription.     Vital Signs:  BP (!) 141/91   Pulse 100   Temp 97.9 F (36.6 C) (Temporal)   Resp 18   LMP 07/20/2023 (Exact Date)   SpO2 100%       Left arm PIV with positive blood return.       Medications:    Medications Administered         0.9 % sodium chloride infusion Admin Date  11/06/2023 Action  New Bag Dose  25 mL/hr Rate  25 mL/hr Route  IntraVENous Documented By  Collie Siad, RN        iron sucrose (VENOFER) 200 mg in sodium chloride 0.9 % 100 mL IVPB Admin Date  11/06/2023 Action  New Bag Dose  200 mg Rate  440 mL/hr Route  IntraVENous Documented By  Collie Siad, RN              Patient tolerated treatment well. Patient discharged from Outpatient Infusion Center ambulatory in no distress. Patient aware of next appointment.    Collie Siad, RN  November 06, 2023    Future Appointments   Date Time Provider Department Center   11/08/2023 10:00 AM SS MED INF CHAIR 6 [facility]

## 2023-11-08 ENCOUNTER — Inpatient Hospital Stay
Admit: 2023-11-08 | Discharge: 2023-11-08 | Payer: PRIVATE HEALTH INSURANCE | Primary: Student in an Organized Health Care Education/Training Program

## 2023-11-08 VITALS — BP 122/83 | HR 89 | Temp 99.00000°F | Resp 18

## 2023-11-08 DIAGNOSIS — D509 Iron deficiency anemia, unspecified: Secondary | ICD-10-CM

## 2023-11-08 MED ORDER — SODIUM CHLORIDE 0.9 % IV SOLN
0.9 | Freq: Once | INTRAVENOUS | Status: AC
Start: 2023-11-08 — End: 2023-11-08
  Administered 2023-11-08: 16:00:00 200 mg via INTRAVENOUS

## 2023-11-08 MED ORDER — SODIUM CHLORIDE 0.9 % IV SOLN
0.9 | INTRAVENOUS | Status: DC | PRN
Start: 2023-11-08 — End: 2023-11-09
  Administered 2023-11-08: 15:00:00 25 mL/h via INTRAVENOUS

## 2023-11-08 MED FILL — IRON SUCROSE 20 MG/ML IV SOLN: 20 MG/ML | INTRAVENOUS | Qty: 10

## 2023-11-08 NOTE — Progress Notes (Signed)
 [facility] Progress Note    Date: November 08, 2023    Name: Rebecca Lyons    MRN: [id number]         DOB: Nov 11, 1985    Rebecca Lyons Arrived ambulatory and in no distress for Venofer Infusion.  Assessment was completed, no acute issues at this time, no new complaints voiced.  24 G PIV established to left arm, + blood return.      Rebecca Lyons's vitals were reviewed.  Vitals:    11/08/23 1058   BP: 122/83   Pulse: 89   Resp: 18   Temp:    SpO2:          Medications:  Medications Administered         0.9 % sodium chloride infusion Admin Date  11/08/2023 Action  New Bag Dose  25 mL/hr Rate  25 mL/hr Route  IntraVENous Documented By  Willy Eddy, RN        iron sucrose (VENOFER) 200 mg in sodium chloride 0.9 % 100 mL IVPB Admin Date  11/08/2023 Action  New Bag Dose  200 mg Rate  480 mL/hr Route  IntraVENous Documented By  Willy Eddy, RN             Rebecca Lyons tolerated treatment well and was discharged from Outpatient Infusion Center in stable condition.   PIV flushed & removed. Patient is aware of future appointments.    No future appointments.    Willy Eddy, RN  November 08, 2023

## 2023-11-08 NOTE — Progress Notes (Signed)
 Spiritual Care Partner Volunteer visited patient at [facility] in [facility] on 11/08/2023     Documented by:  Karle Starch, MDiv  Staff Chaplain  Paging Service [phone] (PRAY)

## 2023-11-15 ENCOUNTER — Encounter

## 2023-11-15 MED ORDER — FAMOTIDINE 20 MG PO TABS
20 | ORAL_TABLET | Freq: Two times a day (BID) | ORAL | 0 refills | 90.00000 days | Status: DC
Start: 2023-11-15 — End: 2024-08-04

## 2023-11-15 MED ORDER — PANTOPRAZOLE SODIUM 40 MG PO TBEC
40 | ORAL_TABLET | Freq: Every day | ORAL | 0 refills | Status: DC
Start: 2023-11-15 — End: 2024-03-29

## 2023-11-15 NOTE — Telephone Encounter (Addendum)
 Medication Refill Request    Rebecca Lyons is requesting a refill of the following medication(s):   Requested Prescriptions     Pending Prescriptions Disp Refills    pantoprazole (PROTONIX) 40 MG tablet 90 tablet 0     Sig: Take 1 tablet by mouth daily    famotidine (PEPCID) 20 MG tablet 180 tablet 0     Sig: Take 1 tablet by mouth 2 times daily        Listed PCP is Lenor Coffin, MD   Last provider to prescribe medication: Constance Goltz  Last Date of Medication Prescribed: 10/29/23   Date of Last Office Visit at [facility]: 10/03/23   FUTURE APPOINTMENT: No future appointments.    Please send refill to:    [facility] [place], [place] - [address] [place] - [place] [phone] Carmon Ginsberg [phone]  [address] [place] [address]  Phone: [phone] Fax: [phone]      Please review request and approve or deny with recommendations.

## 2024-03-29 ENCOUNTER — Encounter

## 2024-03-31 MED ORDER — PANTOPRAZOLE SODIUM 40 MG PO TBEC
40 | ORAL_TABLET | Freq: Every day | ORAL | 0 refills | 30.00000 days | Status: DC
Start: 2024-03-31 — End: 2024-07-09

## 2024-03-31 NOTE — Telephone Encounter (Signed)
 Medication Refill Request    Rebecca Lyons is requesting a refill of the following medication(s):   Requested Prescriptions     Pending Prescriptions Disp Refills    pantoprazole  (PROTONIX ) 40 MG tablet 90 tablet 0     Sig: Take 1 tablet by mouth daily        Listed PCP is Chandra Toribio DASEN, MD   Last provider to prescribe medication: Chandra  Last Date of Medication Prescribed: 11/15/23   Date of Last Office Visit at [facility]: 10/03/23   FUTURE APPOINTMENT: No future appointments.    Please send refill to:    [facility] [place], [place] - [address] [place] - [place] [phone] [place] F[place] [phone]  [address] [place] [address]  Phone: [phone] Fax: [phone]      Please review request and approve or deny with recommendations.

## 2024-06-12 ENCOUNTER — Encounter

## 2024-06-16 MED ORDER — LEVOTHYROXINE SODIUM 150 MCG PO TABS
150 | ORAL_TABLET | Freq: Every day | ORAL | 3 refills | 90.00000 days | Status: DC
Start: 2024-06-16 — End: 2024-06-20

## 2024-06-16 MED ORDER — LEVOTHYROXINE SODIUM 150 MCG PO TABS
150 | ORAL_TABLET | Freq: Every day | ORAL | 3 refills | Status: DC
Start: 2024-06-16 — End: 2024-06-16

## 2024-06-16 MED ORDER — NIFEDIPINE ER OSMOTIC RELEASE 30 MG PO TB24
30 | ORAL_TABLET | Freq: Every day | ORAL | 3 refills | 30.00000 days | Status: DC
Start: 2024-06-16 — End: 2024-09-24

## 2024-06-20 ENCOUNTER — Ambulatory Visit
Admit: 2024-06-20 | Discharge: 2024-06-20 | Payer: PRIVATE HEALTH INSURANCE | Attending: Student in an Organized Health Care Education/Training Program | Primary: Student in an Organized Health Care Education/Training Program

## 2024-06-20 VITALS — BP 128/85 | HR 87 | Temp 98.10000°F | Ht 62.99 in | Wt 194.2 lb

## 2024-06-20 DIAGNOSIS — I152 Hypertension secondary to endocrine disorders: Principal | ICD-10-CM

## 2024-06-20 MED ORDER — LEVOTHYROXINE SODIUM 100 MCG PO TABS
100 | ORAL_TABLET | Freq: Every day | ORAL | 3 refills | 90.00000 days | Status: DC
Start: 2024-06-20 — End: 2024-09-09

## 2024-06-20 NOTE — Progress Notes (Signed)
 "  [name]      History of Present Illness:     Chief Complaint   Patient presents with    Follow-up     88.1kg medication management since delivery. Pain between shoulder blades.  Urgent Care and OB seen-trying acupuncture and massages but not helping.        Rebecca Lyons is a 38 y.o. female with a PMH notable for recent CS complicated by endometritis,  HTN, GERD, hypothyroidism, PCOS,  who presents for the following:    #cHTN - taking nifedipine  90mg  daily. Escalated to 90mg . Following w OB.    #hypothyroidism - last TSH 0.9 and ft4 1.5. synthroid  increased in pregnancy.       PMH (REVIEWED):  Past Medical History:   Diagnosis Date    Hypertension     Hypothyroidism        Current Medications/Allergies (REVIEWED):     Current Outpatient Medications on File Prior to Visit   Medication Sig Dispense Refill    NIFEdipine  (PROCARDIA  XL) 30 MG extended release tablet Take 3 tablets by mouth daily 90 tablet 3    pantoprazole  (PROTONIX ) 40 MG tablet Take 1 tablet by mouth daily 90 tablet 0    famotidine  (PEPCID ) 20 MG tablet Take 1 tablet by mouth 2 times daily 180 tablet 0    Prenatal Vit-Fe Fumarate-FA (PRENATAL VITAMIN PO) Take by mouth      aspirin  81 MG EC tablet Take 1 tablet by mouth daily 90 tablet 1    ferrous sulfate  (IRON  325) 325 (65 Fe) MG tablet Take 1 tablet by mouth every other day 90 tablet 1    aspirin  81 MG chewable tablet Take 1 tablet by mouth daily      estradiol (ESTRACE) 2 MG tablet Take 1 tablet by mouth 3 times daily       No current facility-administered medications on file prior to visit.       No Known Allergies      Review of Systems:     Review of Systems as per HPI    Objective:     Vitals:    06/20/24 1048   BP: 128/85   BP Site: Right Upper Arm   Patient Position: Sitting   BP Cuff Size: Large Adult   Pulse: 87   Temp: 98.1 F (36.7 C)   TempSrc: Oral   SpO2: 98%   Weight: 88.1 kg (194 lb 3.6 oz)   Height: 1.6 m (5' 2.99)       Physical Exam:    General: NAD, well appearing  Resp: no increased WOB  Cardiac: color good, appears well perfused  Abdomen: no abdominal tenderness or distention  Extremities: no edema      Recent Labs:  No results found for this or any previous visit (from the past 12 hours).    Assessment and Plan:     [name] was seen today for follow-up.    Diagnoses and all orders for this visit:    Hypertension due to endocrine disorder  Controlled. Cont nifedipine  for now. May discuss transitioning to lower dose or alternative agent in subsequent visits.    Acquired hypothyroidism  Decreasing dose as would be expected iin postpartum visit. Repeat in 6 weeks.  -     levothyroxine  (SYNTHROID ) 100 MCG tablet; Take 1 tablet by mouth Daily            Follow up: 6-8 weeks.  [name], MD    We discussed the expected course, resolution and complications of the diagnosis(es) in detail.  Medication risks, benefits, costs, interactions, and alternatives were discussed as indicated.  I advised her to contact the office if her condition worsens, changes or fails to improve as anticipated. She expressed understanding with the diagnosis(es) and plan.         "

## 2024-06-20 NOTE — Progress Notes (Signed)
"  Rebecca Lyons is a 38 y.o. female      Chief Complaint   Patient presents with    Follow-up     88.1kg medication management since delivery. Pain between shoulder blades.  Urgent Care and OB seen-trying acupuncture and massages but not helping.        Have you been to the ER, urgent care clinic since your last visit?  Hospitalized since your last visit?    YES - When: approximately 1 months ago.  Where and Why: [facility] for back pain.    Have you seen or consulted any other health care providers outside of [facility] since your last visit?    YES - When: approximately 1 months ago.  Where and Why: see above.            Click Here for Release of Records Request    There were no vitals filed for this visit.        Medication Reconciliation Completed, changes notes. Please Update medication list.  "

## 2024-07-09 ENCOUNTER — Ambulatory Visit
Admit: 2024-07-09 | Discharge: 2024-07-15 | Payer: PRIVATE HEALTH INSURANCE | Primary: Student in an Organized Health Care Education/Training Program

## 2024-07-09 ENCOUNTER — Ambulatory Visit
Admit: 2024-07-09 | Discharge: 2024-07-09 | Payer: PRIVATE HEALTH INSURANCE | Attending: Student in an Organized Health Care Education/Training Program | Primary: Student in an Organized Health Care Education/Training Program

## 2024-07-09 ENCOUNTER — Encounter

## 2024-07-09 VITALS — BP 112/76 | HR 100 | Temp 98.20000°F | Ht 62.99 in | Wt 193.3 lb

## 2024-07-09 DIAGNOSIS — M546 Pain in thoracic spine: Principal | ICD-10-CM

## 2024-07-09 NOTE — Telephone Encounter (Signed)
"  Medication Refill Request    Rebecca Lyons is requesting a refill of the following medication(s):   Requested Prescriptions     Pending Prescriptions Disp Refills    pantoprazole  (PROTONIX ) 40 MG tablet 90 tablet 0     Sig: Take 1 tablet by mouth daily        Listed PCP is Chandra Toribio DASEN, MD   Last provider to prescribe medication: Chandra  Last Date of Medication Prescribed: 03/31/24   Date of Last Office Visit at [facility]: 07/09/2024   FUTURE APPOINTMENT:   Future Appointments   Date Time Provider Department Center   07/09/2024  2:40 PM Chandra Toribio DASEN, MD [facility]   08/14/2024  3:40 PM Chandra Toribio DASEN, MD [facility] DE[facility]       Please send refill to:    [facility] [place], [place] - [address] [place] - [place] [phone] [place] [phone]  [address] [place] [address]  Phone: [phone] Fax: [phone]      Please review request and approve or deny with recommendations.    "

## 2024-07-09 NOTE — Progress Notes (Signed)
"  Rebecca Lyons is a 38 y.o. female      Chief Complaint   Patient presents with    Back Pain     Sharp mid back pain that radiates laterally, pain when touching the area.  Acupuncture isnt helping and getting worst.         Have you been to the ER, urgent care clinic since your last visit?  Hospitalized since your last visit?    NO    Have you seen or consulted any other health care providers outside of [facility] since your last visit?    NO            Click Here for Release of Records Request    Vitals:    07/09/24 1510   BP: 112/76   BP Site: Right Upper Arm   Patient Position: Sitting   BP Cuff Size: Medium Adult   Pulse: 100   Temp: 98.2 F (36.8 C)   TempSrc: Oral   SpO2: 97%   Weight: 87.7 kg (193 lb 5.5 oz)   Height: 1.6 m (5' 2.99)           Medication Reconciliation Completed, changes notes. Please Update medication list.  "

## 2024-07-09 NOTE — Progress Notes (Signed)
 "  [name]      History of Present Illness:     Chief Complaint   Patient presents with    Back Pain     Sharp mid back pain that radiates laterally, pain when touching the area.  Acupuncture isnt helping and getting worst.         Rebecca Lyons is a 38 y.o. female with a PMH notable for recent CS 8/5 complicated by endometritis,  HTN, GERD, hypothyroidism, PCOS  who presents for followup.    #Back pain - since delivery, near site of epidural, has worsened over time. Did acupuncture which she feels made it worse. Now she is sentisitve to even water pressure while showering. No numbness tingling or weakness.    #cHTN - controlled, taking nifedipine .    #hypothyrodisim - decreased some last visit (10/10). Will repeat in 4 weeks.      PMH (REVIEWED):  Past Medical History:   Diagnosis Date    Hypertension     Hypothyroidism        Current Medications/Allergies (REVIEWED):     Current Outpatient Medications on File Prior to Visit   Medication Sig Dispense Refill    levothyroxine  (SYNTHROID ) 100 MCG tablet Take 1 tablet by mouth Daily 90 tablet 3    NIFEdipine  (PROCARDIA  XL) 30 MG extended release tablet Take 3 tablets by mouth daily 90 tablet 3    famotidine  (PEPCID ) 20 MG tablet Take 1 tablet by mouth 2 times daily 180 tablet 0    Prenatal Vit-Fe Fumarate-FA (PRENATAL VITAMIN PO) Take by mouth      aspirin  81 MG EC tablet Take 1 tablet by mouth daily 90 tablet 1    ferrous sulfate  (IRON  325) 325 (65 Fe) MG tablet Take 1 tablet by mouth every other day 90 tablet 1    aspirin  81 MG chewable tablet Take 1 tablet by mouth daily      estradiol (ESTRACE) 2 MG tablet Take 1 tablet by mouth 3 times daily       No current facility-administered medications on file prior to visit.       No Known Allergies      Review of Systems:     Review of Systems as per HPI    Objective:     Vitals:    07/09/24 1510   BP: 112/76   BP Site: Right Upper Arm   Patient Position: Sitting   BP Cuff Size: Medium  Adult   Pulse: 100   Temp: 98.2 F (36.8 C)   TempSrc: Oral   SpO2: 97%   Weight: 87.7 kg (193 lb 5.5 oz)   Height: 1.6 m (5' 2.99)       Physical Exam:   General: NAD, well appearing  Resp: no increased WOB  Cardiac: color good, appears well perfused  Abdomen: no abdominal tenderness or distention  Extremities: no edema  TTP over midline spine at lower thoracic to upper lumbar region. ROM limited by pain. No sensory deficits. There is also tenderness of paraspinals at the sam level.      Recent Labs:  No results found for this or any previous visit (from the past 12 hours).    Assessment and Plan:     Rebecca Lyons was seen today for back pain.    Diagnoses and all orders for this visit:    Acute midline thoracic back pain  Unclear etiology. Had previously thought related to CS/epidural v [facility] strain from  carrying baby, breast feeding, etc. Given bony tenderness, will get XR. Referred to [facility] for further eval, consideration of MRI, PT.  -     Cancel: XR THORACIC SPINE (MIN 4 VIEWS); Future  -     XR LUMBAR SPINE (2-3 VIEWS); Future  -     XR THORACIC SPINE (3 VIEWS); Future            Follow up: 1 mo    [name], MD    We discussed the expected course, resolution and complications of the diagnosis(es) in detail.  Medication risks, benefits, costs, interactions, and alternatives were discussed as indicated.  I advised her to contact the office if her condition worsens, changes or fails to improve as anticipated. She expressed understanding with the diagnosis(es) and plan.         "

## 2024-07-10 MED ORDER — PANTOPRAZOLE SODIUM 40 MG PO TBEC
40 | ORAL_TABLET | Freq: Every day | ORAL | 0 refills | 30.00000 days | Status: AC
Start: 2024-07-10 — End: ?

## 2024-07-16 ENCOUNTER — Ambulatory Visit: Payer: PRIVATE HEALTH INSURANCE | Primary: Student in an Organized Health Care Education/Training Program

## 2024-07-23 ENCOUNTER — Ambulatory Visit: Payer: PRIVATE HEALTH INSURANCE | Primary: Student in an Organized Health Care Education/Training Program

## 2024-07-23 ENCOUNTER — Ambulatory Visit
Admit: 2024-07-23 | Discharge: 2024-07-23 | Payer: PRIVATE HEALTH INSURANCE | Primary: Student in an Organized Health Care Education/Training Program

## 2024-07-23 VITALS — BP 133/81 | HR 99 | Temp 97.50000°F | Wt 195.8 lb

## 2024-07-23 DIAGNOSIS — M898X1 Other specified disorders of bone, shoulder: Principal | ICD-10-CM

## 2024-07-23 MED ORDER — MELOXICAM 15 MG PO TABS
15 | ORAL_TABLET | Freq: Every day | ORAL | 0 refills | Status: AC
Start: 2024-07-23 — End: ?

## 2024-07-23 NOTE — Progress Notes (Signed)
"  Identified pt with two pt identifiers(name and DOB). Reviewed record in preparation for visit and have obtained necessary documentation.  Chief Complaint   Patient presents with    Back Pain     Upper back pain since August after childbirth. Area is tender to touch. Manages with heat and cold therapy. Tylenol/motrin and 6 rounds of acupuncture. Sensitivity started after acupuncture.         Vitals:    07/23/24 1505   BP: 133/81   BP Site: Left Upper Arm   Patient Position: Sitting   BP Cuff Size: Medium Adult   Pulse: 99   Temp: 97.5 F (36.4 C)   TempSrc: Oral   SpO2: 98%   Weight: 88.8 kg (195 lb 12.8 oz)         Coordination of Care Questionnaire:  :     Have you been to the ER, urgent care clinic since your last visit?  Hospitalized since your last visit?    NO    Have you seen or consulted any other health care providers outside of [facility] since your last visit?    NO            Click Here for Release of Records Request   "

## 2024-07-24 NOTE — Progress Notes (Addendum)
 "  [facility]  [facility]   [facility]      Chief Complaint:   Chief Complaint   Patient presents with    Back Pain     Upper back pain since August after childbirth. Area is tender to touch. Manages with heat and cold therapy. Tylenol/motrin and 6 rounds of acupuncture. Sensitivity started after acupuncture.        Subjective:   History: This patient is a 38 y.o. female with a chief complaint of mid back pain.  The patient notes  No bowel o the pain started after her delivery in August, and has worsened over the last 3 to 4 weeks.  She noted that she had undergone 6 session of acupuncture treatments and has since developed some cold and heat sensitivities in the skin of the mid low back.  The patient does have pain with shoulder rotation. No fever, night sweats, or weight changes. No saddle anesthesia.    Review of Systems:  General/Constitutional:  No fever, chills, sweats, fatigue, night sweats, weakness, weight loss or weight gain   Head: No headache, no trauma   Neck: No swelling, masses, stiffness, pain, or limited movement   Cardiac: No chest pain   Respiratory: No cough, shortness of breath, or dyspnea on exertion   GI: No incontinence. No nausea/vomiting, diarrhea, abdominal pain, bloody or dark stools  GU: No incontinence. No change in urinary habits.  Peripheral Vascular: No edema, coldness, numbness, discoloration, pain, or paresthesias   Musculoskeletal: As per HPI  Neurological: No saddle distribution paresthesia. No siatic pain. No loss of consciousness, dizziness, seizures, dysarthria, cognitive changes, problems with balance, or unilateral weakness.    Past Medical History:   Diagnosis Date    Hypertension     Hypothyroidism      Family History   Problem Relation Age of Onset    Thyroid Disease Mother     Diabetes Father     High Blood Pressure Father     Stroke Paternal Grandfather      Current Outpatient Medications   Medication Sig  Dispense Refill    NIFEdipine  (ADALAT  CC) 90 MG extended release tablet Take 1 tablet by mouth daily      meloxicam  (MOBIC ) 15 MG tablet Take 1 tablet by mouth daily 30 tablet 0    pantoprazole  (PROTONIX ) 40 MG tablet Take 1 tablet by mouth daily 90 tablet 0    levothyroxine  (SYNTHROID ) 100 MCG tablet Take 1 tablet by mouth Daily 90 tablet 3    NIFEdipine  (PROCARDIA  XL) 30 MG extended release tablet Take 3 tablets by mouth daily 90 tablet 3    famotidine  (PEPCID ) 20 MG tablet Take 1 tablet by mouth 2 times daily 180 tablet 0     No current facility-administered medications for this visit.     No Known Allergies  Social History     Socioeconomic History    Marital status: Married     Spouse name: Not on file    Number of children: Not on file    Years of education: Not on file    Highest education level: Not on file   Occupational History    Not on file   Tobacco Use    Smoking status: Never    Smokeless tobacco: Never   Vaping Use    Vaping status: Never Used   Substance and Sexual Activity    Alcohol use: Never    Drug  use: Never    Sexual activity: Not Currently     Partners: Male   Other Topics Concern    Not on file   Social History Narrative    Not on file     Social Drivers of Health     Financial Resource Strain: Low Risk  (07/18/2022)    Overall Financial Resource Strain (CARDIA)     Difficulty of Paying Living Expenses: Not hard at all   Food Insecurity: No Food Insecurity (04/12/2024)    Received from [facility]    Hunger Vital Sign     Within the past 12 months, you worried that your food would run out before you got the money to buy more.: Never true     Within the past 12 months, the food you bought just didn't last and you didn't have money to get more.: Never true   Transportation Needs: No Transportation Needs (04/12/2024)    Received from [facility] - Transportation     Lack of Transportation (Medical): No     Lack of Transportation (Non-Medical): No   Physical Activity: Unknown (06/16/2021)     Received from [facility]  (prior to 02/25/2022)    Exercise Vital Sign     On average, how many days per week do you engage in moderate to strenuous exercise (like a brisk walk)?: 2 days     Minutes of Exercise per Session: Not on file   Stress: Not on file   Social Connections: Not on file   Intimate Partner Violence: Not At Risk (06/26/2021)    Humiliation, Afraid, Rape, and Kick questionnaire     Fear of Current or Ex-Partner: No     Emotionally Abused: No     Physically Abused: No     Sexually Abused: No   Housing Stability: Unknown (04/12/2024)    Received from [facility] Stability Vital Sign     In the last 12 months, was there a time when you were not able to pay the mortgage or rent on time?: No     Number of Times Moved in the Last Year: Not on file     At any time in the past 12 months, were you homeless or living in a shelter (including now)?: No       Objective:     BP 133/81 (BP Site: Left Upper Arm, Patient Position: Sitting, BP Cuff Size: Medium Adult)   Pulse 99   Temp 97.5 F (36.4 C) (Oral)   Wt 88.8 kg (195 lb 12.8 oz)   SpO2 98%   BMI 34.69 kg/m     General: Alert and oriented and in no acute distress. Responds to all questions appropriately.   LUNGS: Respirations unlabored.  Skin: No obvious rash.  MSK: Back exam   Posture: Normal   Deformity: Mild left lateral curvature of spine   ROM:     Flexion: Pain on end flexion    Extension: Normal     Lateral bending: Pain on and end motion     Gait: Normal       Palpation:    Tenderness along T4-T8 paraspinal muscles    Shoulder: bilaterally  Deformity: None    ROM:  Forward Flexion: Active: 180   Passive: 180  ER (0): Active: 45   Passive: 45  IR (0): Active: Behind the body to the level L5  Abduction: Active: 180   Passive: 180  C-Spine:  Cervical motion: FROM without pain.  Cervical tenderness: None  Spurlings test: Negative         Assessment:      Diagnosis Orders   1. Periscapular pain  [facility] Therapy  at [facility], [place]    meloxicam  (MOBIC ) 15 MG tablet        38 year old female with midthoracic pain over the last 4 to 5 months which has worsened over the last 3 to 4 weeks with heat/cold sensitivities after acupuncture treatment.  Thoracic x-ray from 07/09/2024 reviewed which showed thoracic kyphosis.  Exam most consistent with periscapular/lower trapezius muscle strain.    Plan:   1. Will place a referral to physical therapy for parascapular/lower trapezius rehab.  2. Will provide a prescription for Meloxicam  and can apply topical Voltaren gel to mid-thoracic region  3. Ice 15 minutes, three times a day PRN and after exercise.  Can alternate with heat for 15 minutes.  4.  If dermatomal paresthesias persist, will consider an Ortho/spine referral for possible thoracic ESI.    Medications:    1.  Acetaminophen (Tylenol):  500mg  1-2 tablets every 6 hours as needed for pain.      RTC: 6 weeks    Case discussed and reviewed with preceptor.      "

## 2024-07-28 NOTE — Progress Notes (Signed)
"  I discussed the findings, assessment and plan with the resident and agree with the resident's findings and plan as documented in the resident's note.  "

## 2024-08-04 ENCOUNTER — Encounter

## 2024-08-04 NOTE — Telephone Encounter (Signed)
"  Medication Refill Request    Rebecca Lyons is requesting a refill of the following medication(s):   Requested Prescriptions     Pending Prescriptions Disp Refills    famotidine  (PEPCID ) 20 MG tablet 180 tablet 0     Sig: Take 1 tablet by mouth 2 times daily        Listed PCP is Chandra Toribio DASEN, MD   Last provider to prescribe medication: Chandra  Last Date of Medication Prescribed: 11/15/23   Date of Last Office Visit at [facility]: 07/23/24   FUTURE APPOINTMENT:   Future Appointments   Date Time Provider Department Center   08/14/2024  3:40 PM Chandra Toribio DASEN, MD [facility] DEP   08/20/2024  9:30 AM Hershal Huxley, PT [facility]   08/27/2024  3:40 PM Ayalew, Blase Moselle, MD [facility] DEP       Please send refill to:    [facility] [place], [place] - [address], [address] [place] [phone] [facility] [phone]  [address], [address] [place] [address]  Phone: [phone] Fax: [phone]      Please review request and approve or deny with recommendations.    "

## 2024-08-05 MED ORDER — CYCLOBENZAPRINE HCL 5 MG PO TABS
5 | ORAL_TABLET | Freq: Every evening | ORAL | 0 refills | 10.00000 days | Status: AC | PRN
Start: 2024-08-05 — End: 2024-09-04

## 2024-08-05 MED ORDER — FAMOTIDINE 20 MG PO TABS
20 | ORAL_TABLET | Freq: Two times a day (BID) | ORAL | 0 refills | 90.00000 days | Status: AC
Start: 2024-08-05 — End: ?

## 2024-08-11 ENCOUNTER — Encounter: Payer: PRIVATE HEALTH INSURANCE | Primary: Student in an Organized Health Care Education/Training Program

## 2024-08-12 ENCOUNTER — Telehealth

## 2024-08-12 ENCOUNTER — Inpatient Hospital Stay
Admit: 2024-08-12 | Discharge: 2024-08-12 | Payer: PRIVATE HEALTH INSURANCE | Primary: Student in an Organized Health Care Education/Training Program

## 2024-08-12 NOTE — Telephone Encounter (Signed)
"  Pt is scheduled with you on 12/04. She stated that she previously discussed with you having a complete blood work up, since having her baby. She is requesting that you upload the lab orders to complete today, due to her appt with you being later in the afternoon.  "

## 2024-08-13 NOTE — Addendum Note (Signed)
"  Addended by: CHANDRA SIEVING on: 08/13/2024 04:28 PM     Modules accepted: Orders    "

## 2024-08-14 ENCOUNTER — Ambulatory Visit
Admit: 2024-08-14 | Discharge: 2024-08-14 | Payer: PRIVATE HEALTH INSURANCE | Attending: Student in an Organized Health Care Education/Training Program | Primary: Student in an Organized Health Care Education/Training Program

## 2024-08-14 VITALS — BP 122/78 | HR 102 | Temp 97.50000°F | Resp 18 | Ht 62.99 in | Wt 194.6 lb

## 2024-08-14 DIAGNOSIS — M549 Dorsalgia, unspecified: Principal | ICD-10-CM

## 2024-08-14 NOTE — Progress Notes (Signed)
"  Rebecca Lyons is a 38 y.o. female    Identified pt with two pt identifiers(name and DOB). Reviewed record in preparation for visit and have obtained necessary documentation.    Chief Complaint   Patient presents with    Other     Patient is coming in for 2 month follow up. She was supposed to have labs done but states that the orders were never put in. No other concerns.        Have you been to the ER, urgent care clinic since your last visit?  Hospitalized since your last visit?    NO    Have you seen or consulted any other health care providers outside of [facility] since your last visit?    NO              Vitals:    08/14/24 1532   BP: 122/78   BP Site: Left Upper Arm   Patient Position: Sitting   BP Cuff Size: Small Adult   Pulse: (!) 102   Resp: 18   Temp: 97.5 F (36.4 C)   TempSrc: Temporal   Weight: 88.3 kg (194 lb 9.6 oz)   Height: 1.6 m (5' 2.99)            Health Maintenance Due   Topic Date Due    Varicella vaccine (1 of 2 - 13+ 2-dose series) Never done    Hepatitis B vaccine (1 of 3 - 19+ 3-dose series) Never done    Flu vaccine (1) 04/11/2024    COVID-19 Vaccine (1 - 2024-25 season) Never done       Click Here for Release of Records Request     Medication Reconciliation completed, changes noted.  Please  Update medication list.   "

## 2024-08-14 NOTE — Progress Notes (Signed)
 "  [facility]      History of Present Illness:     Chief Complaint   Patient presents with    Other     Patient is coming in for 2 month follow up. She was supposed to have labs done but states that the orders were never put in. No other concerns.        Rebecca Lyons is a 38 y.o. female with a PMH notable for  recent CS 8/5 complicated by endometritis,  HTN, GERD, hypothyroidism, PCOS  who presents for followup.    Was not able to get labs previously orded collected due to miscommunications with the clinic. Will get these done soon.    Back pain has not improved. Has not yet started PT but has upcoming appt. Would very much like MRI and ref to specialist, but is ok waiting until upcoming [facility] appt.      PMH (REVIEWED):  Past Medical History:   Diagnosis Date    Hypertension     Hypothyroidism        Current Medications/Allergies (REVIEWED):     Current Outpatient Medications on File Prior to Visit   Medication Sig Dispense Refill    famotidine  (PEPCID ) 20 MG tablet Take 1 tablet by mouth 2 times daily 180 tablet 0    cyclobenzaprine  (FLEXERIL ) 5 MG tablet Take 1 tablet by mouth nightly as needed for Muscle spasms 30 tablet 0    NIFEdipine  (ADALAT  CC) 90 MG extended release tablet Take 1 tablet by mouth daily      meloxicam  (MOBIC ) 15 MG tablet Take 1 tablet by mouth daily 30 tablet 0    pantoprazole  (PROTONIX ) 40 MG tablet Take 1 tablet by mouth daily 90 tablet 0    levothyroxine  (SYNTHROID ) 100 MCG tablet Take 1 tablet by mouth Daily 90 tablet 3    NIFEdipine  (PROCARDIA  XL) 30 MG extended release tablet Take 3 tablets by mouth daily 90 tablet 3     No current facility-administered medications on file prior to visit.       No Known Allergies      Review of Systems:     Review of Systems as per HPI    Objective:     Vitals:    08/14/24 1532   BP: 122/78   BP Site: Left Upper Arm   Patient Position: Sitting   BP Cuff Size: Small Adult   Pulse: (!) 102   Resp: 18   Temp: 97.5 F (36.4  C)   TempSrc: Temporal   Weight: 88.3 kg (194 lb 9.6 oz)   Height: 1.6 m (5' 2.99)       Physical Exam:   General: NAD, well appearing  Resp: no increased WOB  Cardiac: color good, appears well perfused  Abdomen: no abdominal tenderness or distention  Extremities: no edema      Recent Labs:  No results found for this or any previous visit (from the past 12 hours).    Assessment and Plan:     Rebecca Lyons was seen today for other.    Diagnoses and all orders for this visit:    Upper back pain  Seems consistent with myofascial pain syndrome, discussed some myofascial release exercises to do at home. Has upcoming PT, and fu w [facility] clinic. Would very much like MRI and specialist referral, but will await recs from [facility] clinic.    Primary hypertension  Controlled. Awaiting labs    Acquired hypothyroidism  Pending labs. No s/s.    PCOS (polycystic ovarian syndrome)  Peding labs, no s/s.    Iron  deficiency anemia, unspecified iron  deficiency anemia type  Pending labs. No s/s.          Follow up:     Rebecca ONEIDA Slain, MD    We discussed the expected course, resolution and complications of the diagnosis(es) in detail.  Medication risks, benefits, costs, interactions, and alternatives were discussed as indicated.  I advised her to contact the office if her condition worsens, changes or fails to improve as anticipated. She expressed understanding with the diagnosis(es) and plan.         "

## 2024-08-20 ENCOUNTER — Inpatient Hospital Stay
Admit: 2024-08-20 | Payer: PRIVATE HEALTH INSURANCE | Primary: Student in an Organized Health Care Education/Training Program

## 2024-08-20 ENCOUNTER — Inpatient Hospital Stay
Admit: 2024-08-20 | Discharge: 2024-08-20 | Payer: PRIVATE HEALTH INSURANCE | Primary: Student in an Organized Health Care Education/Training Program

## 2024-08-20 ENCOUNTER — Encounter

## 2024-08-20 DIAGNOSIS — M546 Pain in thoracic spine: Principal | ICD-10-CM

## 2024-08-20 NOTE — Therapy Evaluation (Signed)
 "  Physical Therapy at [facility],   a part of [facility]  [address] [place], [address]  [place], [place]  [address]  Phone: [phone]  Fax: [phone]     PHYSICAL THERAPY - EVALUATION/PLAN OF CARE NOTE (updated 3/23)    Date: 08/20/2024        Patient Name:  Rebecca Lyons DOB:  March 01, 1986   Medical   Diagnosis:  Periscapular pain [M89.8X1] Treatment Diagnosis:  M54.6  THORACIC PAIN    Referral Source:  Rebecca Blase Moselle, MD Provider #:  [id number]                Insurance: Payor: MICHAELL / Plan: MICHAELL PLUS PPO / Product Type: *No Product type* /      Patient DOB verified yes     Visit #   Current  / Total 1 awaiting   Time   In / Out 930a 1033a   Total Treatment Time 63   Total Timed Codes 30     SUBJECTIVE  If an interpreting service was utilized for treatment of this patient, the contents of this document represent the material reviewed with the patient via the interpreter.     Pain Level (0-10 scale): 8 worst, 3 lowest, 7 current  [] constant [] intermittent [] improving [] worsening [] no change since onset    Any medication changes, allergies to medications, adverse drug reactions, diagnosis change, or new procedure performed?: [x]  No    []  Yes (see summary sheet for update)  Medications: Verified on Patient Summary List    Subjective functional status/changes:       Had 1st csection 4 months ago 04/2024- induction failure/preeclampsia. Had to return for infection Since then has had mid back pain that starts in the center and spreads to the sides like a band. Massage helped a little, acupuncture brought on sensory symptoms. Having sensitivity to pressure, hot, cold. Used to be constant but now more worse with both arms overhead, reaching, lifting, holding her son. Wore a binder for first two months. Is not breastfeeding. Can have pain with sitting positions. Uses feeding pillow in bed at night and on sofa during the day. Holds son on R side. Is not bothering her  to sleep. Medications not helping much. Denies paresthesia. Didn't have any symptoms during pregnancy.     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 Situation: with son and husband  Mobility: ind  Self Care: ind  Previous Treatment/Compliance: medications, massage, acupuncture  PMHx/Surgical Hx/Comorbidites:     Past Medical History:  No date: Hypertension  No date: Hypothyroidism     Past Surgical History:  No date: CESAREAN SECTION     Prior Hospitalization:delivery  Barriers: [] pain [] Financial [] time [] transportation [] Other:  Substance use: [] Alcohol [] Tobacco [] other:   Pt Goals: help with muscle pain, posture, strengthen core  Motivation: motivated  Cognition: A & O x 4        OBJECTIVE    Lumbar ROM  R  L    Flexion   Fingers 3 from floor p!   Extension  100% p!  Side bending  Fingers to joint line B  Rotation  75%  50%p!    Upper Quarter  MMT  R  L (pain with all)  Sh ABD    5/5  4/5  SH ADD    5/5  4/5  SH flex     5/5  4/5  SH ext    5/5  4/5  SH IR     4/5  4/5  SH ER    4/5  4/5    Abdominals: curl up- pain    DRA:   Above umbilicus: 0   At umbilicus: 1   Below umbilicus: 0    Special Tests:   ASLR: R + L +   TTP T4-12 paraspinals, slightly impaired scapulohumeral rhythm on R with scaption    Forward shoulders B    Objective/Functional Outcome Measure: Thoracic FOTO Score: 64  FOTO score = an established functional score where 100 = no disability    33 min [x] Eval - untimed                      Therapeutic Procedures:  Tx Min Billable or 1:1 Min (if diff from Tx Min) Procedure, Rationale, Specifics   30  [id number] Neuromuscular Re-Education (timed):  improve balance, coordination, kinesthetic sense, posture, core stability and  proprioception to improve patient's ability to develop conscious control of individual muscles and awareness of position of extremities in order to progress to PLOF and address remaining functional goals. (see flow sheet as applicable)    Details if applicable:  scar massage, sitting posture   30     Total Total     [x]   Patient Education billed concurrently with other procedures   [x]  Review HEP    []  Progressed/Changed HEP, detail:    []  Other detail:       Pain Level at end of session (0-10 scale): 5    Plan of Care / Statement of Necessity for Physical Therapy Services     Assessment / key information:  Rebecca Lyons is a pleasant 38 yo Female presenting to OP PT with c/o mid back pain since her 1st Csection in August 2025. She notes pain is worst with prolonged sitting, lifting, reaching, and bending. Today she demonstrates good lumbar AROM but pain during movement and impaired UE MMT, pelvic stability, and UE coordination. She would benefit from skilled PT services to address above deficits and improve QoL to allow for all ADLs without restriction.      Evaluation Complexity:  History:  MEDIUM  Complexity : 1-2 comorbidities / personal factors will impact the outcome/ POC ; Examination:  MEDIUM Complexity : 3 Standardized tests and measures addressin body structure, function, activity limitation and / or participation in recreation  ;Presentation:  MEDIUM Complexity : Evolving with changing characteristics  ;Clinical Decision Making:  MEDIUM Complexity : FOTO score of 26-74 Overall Complexity Rating: MEDIUM  Problem List: pain affecting function, decrease ROM, decrease strength, decrease ADL/functional abilities, and decrease activity tolerance   Treatment Plan may include any combination of the following: 02889 Therapeutic Exercise, 97112 Neuromuscular Re-Education, 97140 Manual Therapy, 97530 Therapeutic Activity, 97535 Self Care/Home Management, 97014 Electrical Stim unattended / [phone] [facility]), [phone] Electrical Stim  attended, [phone] Gait Training, and (Elective Self Pay) Needle Insertion w/o Injection (1 or 2 muscles), (3+ muscles)  Patient / Family readiness to learn indicated by: asking questions, trying to perform skills, interest, return verbalization , and return demonstration   Persons(s) to be included in education: patient (P)  Barriers to Learning/Limitations: none  Measures taken if barriers to learning present: na  Patient Self Reported Health Status: excellent  Rehabilitation Potential: good    Short Term Goals: To be accomplished in 4 treatments.  Patient will be independent with initial HEP in order to transition to general wellness program.  Patient will report worst pain level of 6/10 or better to allow for a minimum of 10 minutes of driving.    The patient will report ability to unload groceries from car into home without an increase in pain within the last 5 days  The patient will demonstrate ability to lift 2 pounds overhead with pain level of 5/10 or less to improve ease of putting away dishes in kitchen     Long Term Goals: To be accomplished in 12 treatments.  Patient will report worst pain no greater than 2/10 to increase QOL and allow for independence with all hygienic self-care and ADL skills   Patient will demonstrate 5/5 BUE strength to allow for home cleaning skills independently and without rest breaks needed  Patient will demonstrate pain free lumbar AROM WFL to improve ease with household chores like emptying the dishwasher     Frequency / Duration: Patient to be seen 1 times per week for 12 treatments.    Patient/ Caregiver education and instruction: Diagnosis, prognosis, self care, activity modification, and exercises     [x]   Plan of care has been reviewed with PTA    Renate Crouch, PT       08/20/2024       9:34 AM    ===================================================================  I certify that the above Therapy Services are being furnished while the patient is under my care. I agree  with the treatment plan and certify that this therapy is necessary.    Physician's Signature:_________________________   DATE:_________   TIME:________                           Rebecca Blase Moselle, MD    ** Signature, Date and Time must be completed for valid certification **  Please sign and fax to [phone].  Thank you  "

## 2024-08-21 LAB — CBC
Hematocrit: 40.1 % (ref 35.0–47.0)
Hemoglobin: 13.2 g/dL (ref 11.5–16.0)
MCH: 26.2 pg (ref 26.0–34.0)
MCHC: 32.9 g/dL (ref 30.0–36.5)
MCV: 79.7 FL — ABNORMAL LOW (ref 80.0–99.0)
MPV: 11.3 FL (ref 8.9–12.9)
Nucleated RBCs: 0 /100{WBCs}
Platelets: 329 K/uL (ref 150–400)
RBC: 5.03 M/uL (ref 3.80–5.20)
RDW: 13.7 % (ref 11.5–14.5)
WBC: 5.9 K/uL (ref 3.6–11.0)
nRBC: 0 K/uL (ref 0.00–0.01)

## 2024-08-21 LAB — TSH: TSH, 3rd Generation: 14 u[IU]/mL — ABNORMAL HIGH (ref 0.270–4.200)

## 2024-08-21 LAB — LIPID PANEL
Chol/HDL Ratio: 3.3 (ref 0.0–5.0)
Cholesterol, Total: 212 mg/dL — ABNORMAL HIGH (ref 0–200)
HDL: 65 mg/dL — ABNORMAL HIGH (ref 40–60)
LDL Cholesterol: 115 mg/dL — ABNORMAL HIGH (ref 0–100)
Triglycerides: 158 mg/dL — ABNORMAL HIGH (ref 0–150)
VLDL Cholesterol Calculated: 32 mg/dL

## 2024-08-21 LAB — ALBUMIN/CREATININE RATIO, URINE
Albumin Urine: 7.61 mg/dL
Albumin/Creatinine Ratio: 16 mg/g (ref 0–30)
Creatinine, Ur: 466 mg/dL — ABNORMAL HIGH (ref 28.00–217.00)

## 2024-08-21 LAB — HEMOGLOBIN A1C
Estimated Avg Glucose: 106 mg/dL
Hemoglobin A1C: 5.3 % (ref 4.0–5.6)

## 2024-08-26 ENCOUNTER — Inpatient Hospital Stay
Admit: 2024-08-26 | Payer: PRIVATE HEALTH INSURANCE | Primary: Student in an Organized Health Care Education/Training Program

## 2024-08-26 NOTE — Progress Notes (Signed)
 "PHYSICAL THERAPY - DAILY TREATMENT NOTE (updated 3/23)    Date: 08/26/2024        Patient Name:  Rebecca Lyons DOB:  05-21-1986   Medical   Diagnosis:  Periscapular pain [M89.8X1] Treatment Diagnosis:  M54.6  THORACIC PAIN    Referral Source:  Henry Reyes NOVAK, MD Insurance:   Payor: MICHAELL / Plan: [employer]ELL PLUS PPO / Product Type: *No Product type* /                   Patient DOB verified yes     Visit #   Current  / Total 2 22   Time   In / Out 1030a 1118a   Total Treatment Time 48   Total Timed Codes 48     SUBJECTIVE  If an interpreting service was utilized for treatment of this patient, the contents of this document represent the material reviewed with the patient via the interpreter.     Pain Level (0-10 scale): 6    Any medication changes, allergies to medications, adverse drug reactions, diagnosis change, or new procedure performed?: [x]  No    []  Yes (see summary sheet for update)  Medications: Verified on Patient Summary List    Subjective functional status/changes:       Was sore in abdomen and scar after last session. Returned back down to normal pain. I feel like it has helped. Some pain with holding her son still- has to hold him upright afterward. Did HEP twice since last visit. Still waking up with pain.     OBJECTIVE    Therapeutic Procedures:  Tx Min Billable or 1:1 Min (if diff from Tx Min) Procedure, Rationale, Specifics   25  97110 Therapeutic Exercise (timed):  increase ROM, strength, coordination, balance, and proprioception to improve patient's ability to progress to PLOF and address remaining functional goals. (see flow sheet as applicable)     Details if applicable: feeding positions   13  97140 Manual Therapy (timed):  decrease pain, increase ROM, increase tissue extensibility, decrease trigger points, and increase postural awareness to improve patient's ability to progress to PLOF and address remaining functional goals.  The manual therapy interventions were performed at a  separate and distinct time from the therapeutic activities interventions . (see flow sheet as applicable)     Details if applicable: gentle STM/CFM thoracic paraspinals, gr 1 P-A mob T6-12   10  97112 Neuromuscular Re-Education (timed):  improve balance, coordination, kinesthetic sense, posture, core stability and proprioception to improve patient's ability to develop conscious control of individual muscles and awareness of position of extremities in order to progress to PLOF and address remaining functional goals. (see flow sheet as applicable)    Details if applicable:     48     Total Total     [x]   Patient Education billed concurrently with other procedures   [x]  Review HEP    []  Progressed/Changed HEP, detail:    []  Other detail:       Other Objective/Functional Measures    Most discomfort at mid thoracic levels during manual therapy. Pt noted increased resistance to stretch on L with rotation compared to R.     Pain Level at end of session (0-10 scale): 6    Assessment     Pain in mid superior abdomen with supine TA work improved with repetitions. Most discomfort during scap retraction exercise and manual. Encouraged pt to increase frequency of HEP to progress towards goals.  Patient will continue to benefit from skilled PT / OT services to modify and progress therapeutic interventions, analyze and address functional mobility deficits, analyze and address ROM deficits, analyze and address strength deficits, analyze and address soft tissue restrictions, analyze and cue for proper movement patterns, analyze and modify for postural abnormalities, and instruct in home and community integration to address functional deficits and attain remaining goals.    Progress toward goals / Updated goals:  []   See Progress Note/Recertification    Short Term Goals: To be accomplished in 4 treatments.- IN PROGRESS  Patient will be independent with initial HEP in order to transition to general wellness program.  Patient will  report worst pain level of 6/10 or better to allow for a minimum of 10 minutes of driving.    The patient will report ability to unload groceries from car into home without an increase in pain within the last 5 days  The patient will demonstrate ability to lift 2 pounds overhead with pain level of 5/10 or less to improve ease of putting away dishes in kitchen      Long Term Goals: To be accomplished in 12 treatments.  Patient will report worst pain no greater than 2/10 to increase QOL and allow for independence with all hygienic self-care and ADL skills   Patient will demonstrate 5/5 BUE strength to allow for home cleaning skills independently and without rest breaks needed  Patient will demonstrate pain free lumbar AROM WFL to improve ease with household chores like emptying the dishwasher     PLAN  Yes  Continue plan of care  Re-Cert Due: 12 visits  [x]   Upgrade activities as tolerated  []   Discharge due to:  []   Other:    Mima Cranmore, PT       08/26/2024       10:33 AM  "

## 2024-08-27 ENCOUNTER — Encounter: Payer: PRIVATE HEALTH INSURANCE | Primary: Student in an Organized Health Care Education/Training Program

## 2024-09-02 ENCOUNTER — Inpatient Hospital Stay
Admit: 2024-09-02 | Payer: PRIVATE HEALTH INSURANCE | Primary: Student in an Organized Health Care Education/Training Program

## 2024-09-02 NOTE — Progress Notes (Signed)
 "PHYSICAL THERAPY - DAILY TREATMENT NOTE (updated 3/23)    Date: 09/02/2024        Patient Name:  Rebecca Lyons DOB:  01/25/1986   Medical   Diagnosis:  Periscapular pain [M89.8X1] Treatment Diagnosis:  M54.6  THORACIC PAIN    Referral Source:  Sedrick Blase Moselle, MD Insurance:   Payor: MICHAELL / Plan: [employer] PPO / Product Type: *No Product type* /                   Patient DOB verified yes     Visit #   Current  / Total 3 22   Time   In / Out 802a 847a   Total Treatment Time 45   Total Timed Codes 45     SUBJECTIVE  If an interpreting service was utilized for treatment of this patient, the contents of this document represent the material reviewed with the patient via the interpreter.     Pain Level (0-10 scale): 5    Any medication changes, allergies to medications, adverse drug reactions, diagnosis change, or new procedure performed?: [x]  No    []  Yes (see summary sheet for update)  Medications: Verified on Patient Summary List    Subjective functional status/changes:       Little more sore on the left side today. Was more sore after last visits for a couple days. Did heat and ice. Scar is sore R>L but has been massaging. Has family in town.    OBJECTIVE    Therapeutic Procedures:  Tx Min Billable or 1:1 Min (if diff from Tx Min) Procedure, Rationale, Specifics   30  97110 Therapeutic Exercise (timed):  increase ROM, strength, coordination, balance, and proprioception to improve patient's ability to progress to PLOF and address remaining functional goals. (see flow sheet as applicable)     Details if applicable:      97140 Manual Therapy (timed):  decrease pain, increase ROM, increase tissue extensibility, decrease trigger points, and increase postural awareness to improve patient's ability to progress to PLOF and address remaining functional goals.  The manual therapy interventions were performed at a separate and distinct time from the therapeutic activities interventions . (see flow sheet as  applicable)     Details if applicable: gentle STM/CFM thoracic paraspinals, gr 1 P-A mob T6-12   15  97112 Neuromuscular Re-Education (timed):  improve balance, coordination, kinesthetic sense, posture, core stability and proprioception to improve patient's ability to develop conscious control of individual muscles and awareness of position of extremities in order to progress to PLOF and address remaining functional goals. (see flow sheet as applicable)    Details if applicable:     45     Total Total     [x]   Patient Education billed concurrently with other procedures   [x]  Review HEP    []  Progressed/Changed HEP, detail:    []  Other detail:       Other Objective/Functional Measures     Pain Level at end of session (0-10 scale): 6    Assessment     Some breath holding, incomplete exhale during TA training improved with verbal and visual cueing. Felt work during PPT in abdomen. More discomfort with R sided thoracic rotation today- unchanged. Most cueing to reduce accessory ms use in posterior mediastinum exercise improved with verbal and tactile cueing.     Encouraged pt to increase frequency of HEP to progress towards goals.     Patient will continue to benefit from skilled  PT / OT services to modify and progress therapeutic interventions, analyze and address functional mobility deficits, analyze and address ROM deficits, analyze and address strength deficits, analyze and address soft tissue restrictions, analyze and cue for proper movement patterns, analyze and modify for postural abnormalities, and instruct in home and community integration to address functional deficits and attain remaining goals.    Progress toward goals / Updated goals:  []   See Progress Note/Recertification    Short Term Goals: To be accomplished in 4 treatments.- IN PROGRESS  Patient will be independent with initial HEP in order to transition to general wellness program.  Patient will report worst pain level of 6/10 or better to allow for  a minimum of 10 minutes of driving.    The patient will report ability to unload groceries from car into home without an increase in pain within the last 5 days  The patient will demonstrate ability to lift 2 pounds overhead with pain level of 5/10 or less to improve ease of putting away dishes in kitchen      Long Term Goals: To be accomplished in 12 treatments.  Patient will report worst pain no greater than 2/10 to increase QOL and allow for independence with all hygienic self-care and ADL skills   Patient will demonstrate 5/5 BUE strength to allow for home cleaning skills independently and without rest breaks needed  Patient will demonstrate pain free lumbar AROM WFL to improve ease with household chores like emptying the dishwasher     PLAN  Yes  Continue plan of care  Re-Cert Due: 12 visits  [x]   Upgrade activities as tolerated  []   Discharge due to:  []   Other:    Renate Crouch, PT       09/02/2024       8:02 AM  "

## 2024-09-09 ENCOUNTER — Encounter

## 2024-09-09 MED ORDER — LEVOTHYROXINE SODIUM 125 MCG PO TABS
125 | ORAL_TABLET | Freq: Every day | ORAL | 1 refills | 90.00000 days | Status: AC
Start: 2024-09-09 — End: ?

## 2024-09-10 ENCOUNTER — Inpatient Hospital Stay
Admit: 2024-09-10 | Payer: PRIVATE HEALTH INSURANCE | Primary: Student in an Organized Health Care Education/Training Program

## 2024-09-10 NOTE — Progress Notes (Signed)
 "PHYSICAL THERAPY - DAILY TREATMENT NOTE (updated 3/23)    Date: 09/10/2024        Patient Name:  Rebecca Lyons DOB:  Sep 16, 1985   Medical   Diagnosis:  Periscapular pain [M89.8X1] Treatment Diagnosis:  M54.6  THORACIC PAIN    Referral Source:  Sedrick Blase Moselle, MD Insurance:   Payor: MICHAELL / Plan: [employer] PPO / Product Type: *No Product type* /                   Patient DOB verified yes     Visit #   Current  / Total 4 22   Time   In / Out 845a 945a   Total Treatment Time 60   Total Timed Codes 60     SUBJECTIVE  If an interpreting service was utilized for treatment of this patient, the contents of this document represent the material reviewed with the patient via the interpreter.     Pain Level (0-10 scale): 6    Any medication changes, allergies to medications, adverse drug reactions, diagnosis change, or new procedure performed?: [x]  No    []  Yes (see summary sheet for update)  Medications: Verified on Patient Summary List    Subjective functional status/changes:       Pain was really high two days after last session- the worst it had ever been. Working more on sleep training so is holding the baby more. It feels weak. Still working on her scar twice weekly. Doing some breathing/core work and feeling more sore there. Adjusting feeding position. Reaching for baby in bassinet is one of the most challenging things still. Was able to use ergobaby for almost 20 minutes for the first time.     OBJECTIVE    Therapeutic Procedures:  Tx Min Billable or 1:1 Min (if diff from Tx Min) Procedure, Rationale, Specifics   40  97110 Therapeutic Exercise (timed):  increase ROM, strength, coordination, balance, and proprioception to improve patient's ability to progress to PLOF and address remaining functional goals. (see flow sheet as applicable)     Details if applicable:      97140 Manual Therapy (timed):  decrease pain, increase ROM, increase tissue extensibility, decrease trigger points, and increase postural  awareness to improve patient's ability to progress to PLOF and address remaining functional goals.  The manual therapy interventions were performed at a separate and distinct time from the therapeutic activities interventions . (see flow sheet as applicable)     Details if applicable: gentle STM/CFM thoracic paraspinals, gr 1 P-A mob T6-12   20  97112 Neuromuscular Re-Education (timed):  improve balance, coordination, kinesthetic sense, posture, core stability and proprioception to improve patient's ability to develop conscious control of individual muscles and awareness of position of extremities in order to progress to PLOF and address remaining functional goals. (see flow sheet as applicable)    Details if applicable: pain science education   60     Total Total     [x]   Patient Education billed concurrently with other procedures   [x]  Review HEP    []  Progressed/Changed HEP, detail:    []  Other detail:       Other Objective/Functional Measures     Pain Level at end of session (0-10 scale): 6    Assessment     Pt reported most challenge with pec stretch and wall ball circles on L side. Better tolerance of rows than shoulder ER and pull downs. Updated HEP to promote variations that  will increase adherence.     Discussed mechanism of chronic pain in pain science education today including nerve hypersensitivity. Pt demonstrates understanding of graded desensitization with ice/heat/pressure at home to facilitate healing. Discussed impact of sleep hygiene and other factors on rate of recovery as well.     Patient will continue to benefit from skilled PT / OT services to modify and progress therapeutic interventions, analyze and address functional mobility deficits, analyze and address ROM deficits, analyze and address strength deficits, analyze and address soft tissue restrictions, analyze and cue for proper movement patterns, analyze and modify for postural abnormalities, and instruct in home and community integration  to address functional deficits and attain remaining goals.    Progress toward goals / Updated goals:  []   See Progress Note/Recertification    Short Term Goals: To be accomplished in 4 treatments.- IN PROGRESS  Patient will be independent with initial HEP in order to transition to general wellness program.  Patient will report worst pain level of 6/10 or better to allow for a minimum of 10 minutes of driving.    The patient will report ability to unload groceries from car into home without an increase in pain within the last 5 days  The patient will demonstrate ability to lift 2 pounds overhead with pain level of 5/10 or less to improve ease of putting away dishes in kitchen      Long Term Goals: To be accomplished in 12 treatments.  Patient will report worst pain no greater than 2/10 to increase QOL and allow for independence with all hygienic self-care and ADL skills   Patient will demonstrate 5/5 BUE strength to allow for home cleaning skills independently and without rest breaks needed  Patient will demonstrate pain free lumbar AROM WFL to improve ease with household chores like emptying the dishwasher     PLAN  Yes  Continue plan of care  Re-Cert Due: 12 visits  [x]   Upgrade activities as tolerated  []   Discharge due to:  []   Other:    Ketrina Boateng, PT       09/10/2024       8:47 AM  "

## 2024-09-17 ENCOUNTER — Encounter

## 2024-09-17 ENCOUNTER — Inpatient Hospital Stay
Admit: 2024-09-17 | Payer: PRIVATE HEALTH INSURANCE | Primary: Student in an Organized Health Care Education/Training Program

## 2024-09-17 DIAGNOSIS — M898X1 Other specified disorders of bone, shoulder: Principal | ICD-10-CM

## 2024-09-17 NOTE — Progress Notes (Signed)
 PHYSICAL THERAPY - DAILY TREATMENT NOTE (updated 3/23)    Date: 09/17/2024        Patient Name:  Rebecca Lyons DOB:  August 31, 1986   Medical   Diagnosis:  Periscapular pain [M89.8X1] Treatment Diagnosis:  M54.6  THORACIC PAIN    Referral Source:  Sedrick Blase Moselle, MD Insurance:   Payor: MICHAELL / Plan: [employer] PPO / Product Type: *No Product type* /                   Patient DOB verified yes     Visit #   Current  / Total 5 22   Time   In / Out 1000a 945a   Total Treatment Time 60   Total Timed Codes 60     SUBJECTIVE  If an interpreting service was utilized for treatment of this patient, the contents of this document represent the material reviewed with the patient via the interpreter.     Pain Level (0-10 scale): 8/10    Any medication changes, allergies to medications, adverse drug reactions, diagnosis change, or new procedure performed?: [x]  No    []  Yes (see summary sheet for update)  Medications: Verified on Patient Summary List    Subjective functional status/changes:       Patient reported that the pain is starting to feel uncontrollable. Patient reached out to the MD about some kind of medication to help manage the pain.     OBJECTIVE    Therapeutic Procedures:  Tx Min Billable or 1:1 Min (if diff from Tx Min) Procedure, Rationale, Specifics     97110 Therapeutic Exercise (timed):  increase ROM, strength, coordination, balance, and proprioception to improve patient's ability to progress to PLOF and address remaining functional goals. (see flow sheet as applicable)     Details if applicable:      97140 Manual Therapy (timed):  decrease pain, increase ROM, increase tissue extensibility, decrease trigger points, and increase postural awareness to improve patient's ability to progress to PLOF and address remaining functional goals.  The manual therapy interventions were performed at a separate and distinct time from the therapeutic activities interventions . (see flow sheet as applicable)     Details  if applicable: gentle STM/CFM thoracic paraspinals, gr 1 P-A mob T6-12   45  97112 Neuromuscular Re-Education (timed):  improve balance, coordination, kinesthetic sense, posture, core stability and proprioception to improve patient's ability to develop conscious control of individual muscles and awareness of position of extremities in order to progress to PLOF and address remaining functional goals. (see flow sheet as applicable)    Details if applicable: pain science education and PRI intervention   45     Total Total     [x]   Patient Education billed concurrently with other procedures   [x]  Review HEP    []  Progressed/Changed HEP, detail:    []  Other detail:       Other Objective/Functional Measures   Educated on densensitization techniques    Mod restriction with any flexion based positioning    Pain Level at end of session (0-10 scale): 7    Assessment       Patient will continue to benefit from skilled PT / OT services to modify and progress therapeutic interventions, analyze and address functional mobility deficits, analyze and address ROM deficits, analyze and address strength deficits, analyze and address soft tissue restrictions, analyze and cue for proper movement patterns, analyze and modify for postural abnormalities, and instruct in home and community  integration to address functional deficits and attain remaining goals.    Progress toward goals / Updated goals:  []   See Progress Note/Recertification    Short Term Goals: To be accomplished in 4 treatments.- IN PROGRESS  Patient will be independent with initial HEP in order to transition to general wellness program.  Patient will report worst pain level of 6/10 or better to allow for a minimum of 10 minutes of driving.    The patient will report ability to unload groceries from car into home without an increase in pain within the last 5 days  The patient will demonstrate ability to lift 2 pounds overhead with pain level of 5/10 or less to improve ease of  putting away dishes in kitchen      Long Term Goals: To be accomplished in 12 treatments.  Patient will report worst pain no greater than 2/10 to increase QOL and allow for independence with all hygienic self-care and ADL skills   Patient will demonstrate 5/5 BUE strength to allow for home cleaning skills independently and without rest breaks needed  Patient will demonstrate pain free lumbar AROM WFL to improve ease with household chores like emptying the dishwasher     PLAN  Yes  Continue plan of care  Re-Cert Due: 12 visits  [x]   Upgrade activities as tolerated  []   Discharge due to:  []   Other:    Duana Benedict VAN DECKER, PTA       09/17/2024       10:07 AM

## 2024-09-18 MED ORDER — GABAPENTIN 100 MG PO CAPS
100 | ORAL_CAPSULE | Freq: Three times a day (TID) | ORAL | 0 refills | 30.00000 days | Status: AC
Start: 2024-09-18 — End: 2024-10-18

## 2024-09-24 ENCOUNTER — Ambulatory Visit
Admit: 2024-09-24 | Discharge: 2024-09-24 | Payer: PRIVATE HEALTH INSURANCE | Primary: Student in an Organized Health Care Education/Training Program

## 2024-09-24 ENCOUNTER — Inpatient Hospital Stay
Admit: 2024-09-24 | Payer: PRIVATE HEALTH INSURANCE | Primary: Student in an Organized Health Care Education/Training Program

## 2024-09-24 VITALS — BP 156/107 | HR 73 | Temp 98.30000°F | Resp 16 | Ht 62.99 in | Wt 196.0 lb

## 2024-09-24 DIAGNOSIS — M25512 Pain in left shoulder: Principal | ICD-10-CM

## 2024-09-24 MED ORDER — LIDOCAINE 4 % EX PTCH
4 | MEDICATED_PATCH | Freq: Every day | CUTANEOUS | 0 refills | Status: AC
Start: 2024-09-24 — End: 2024-10-24

## 2024-09-24 MED ORDER — LIDOCAINE HCL 1 % IJ SOLN
1 | Freq: Once | INTRAMUSCULAR | Status: AC
Start: 2024-09-24 — End: 2024-09-24
  Administered 2024-09-24: 22:00:00 5 mL

## 2024-09-24 MED ORDER — DEXAMETHASONE SODIUM PHOSPHATE 120 MG/30ML IJ SOLN
120 | Freq: Once | INTRAMUSCULAR | Status: AC
Start: 2024-09-24 — End: 2024-09-24
  Administered 2024-09-24: 22:00:00 4 mg via INTRAMUSCULAR

## 2024-09-24 NOTE — Progress Notes (Signed)
 Rebecca Lyons is a 39 y.o. female      Chief Complaint   Patient presents with    Back Pain     - upper mid region        Have you been to the ER, urgent care clinic since your last visit?  Hospitalized since your last visit?    NO    Have you seen or consulted any other health care providers outside of [facility] since your last visit?    NO              Vitals:    09/24/24 1450   Pulse: 73   Resp: 16   Temp: 98.3 F (36.8 C)   TempSrc: Temporal   SpO2: 98%   Weight: 88.9 kg (196 lb)   Height: 1.6 m (5' 2.99)            Health Maintenance Due   Topic Date Due    Varicella vaccine (1 of 2 - 13+ 2-dose series) Never done    Hepatitis B vaccine (1 of 3 - 19+ 3-dose series) Never done    Flu vaccine (1) 04/11/2024    COVID-19 Vaccine (1 - 2024-25 season) Never done         Medication Reconciliation completed, changes noted.  Please  Update medication list.

## 2024-09-24 NOTE — Progress Notes (Signed)
 Rebecca Lyons (DOB:  09/26/1985) is a 39 y.o. female,Established patient, here for evaluation of the following chief complaint(s):  Back Pain (- upper mid region )      Procedure:  Informed consent - Risks, benefits and alternatives discussed.  Time Out - Performed, cross checking patient ID and procedure.  Preparation - Cleaned and prepped with alcohol swab x3.    Anesthesia - Ethyl chloride spray used to anesthitise the skin prior to injection.   Description of procedure - 1 trigger point identified in the left parascapular muscle and each injected with 1 ml dexamethasone  and 1 ml of 0.25% marcaine (50:50) mixture under sterile conditions. Patient tolerated the procedure well and there were no complications. Patient reports pain relief following the injections.    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  Pain reproduced on torso rotation and left shoulder flexion.  - Performed left parascapular trigger point-tenderness during this visit  - Will prescribe lidocaine  patch to apply to the area  - Continue gabapentin  as ordered by PCP.  - Continue physical therapy  Orders:    lidocaine  4 % external patch; Place 1 patch onto the skin daily    dexAMETHasone  Sodium Phosphate  IntraMUSCular 4 mg    lidocaine  1 % injection 5 mL      Follow-up as needed       Subjective   Patient presents today for a 32-month follow-up of worsening left periscapular pain.  The patient notes her pain started intermittently after the birth of her child approximately 6 months ago.  The patient saw an acupuncturist and was getting needlework 3 to 4 months prior which exacerbated her pain.  She was evaluated at the last appointment where physical therapy was ordered.  She notes she has not had any improvement in her pain, but has been having  improved strength in her shoulders.  The patient is sensitive to light touch in the mid thoracic/periscapular area, and her pain is exacerbated with heavy lifting.  The patient has tried Voltaren gel, heating pads and will start on gabapentin .        Review of Systems   All other systems reviewed and are negative.         Objective   Physical Exam  Vitals and nursing note reviewed.   Constitutional:       Appearance: Normal appearance.   HENT:      Head: Normocephalic.      Nose: Nose normal.      Mouth/Throat:      Mouth: Mucous membranes are moist.   Eyes:      Extraocular Movements: Extraocular movements intact.   Cardiovascular:      Rate and Rhythm: Normal rate and regular rhythm.      Pulses: Normal pulses.   Pulmonary:      Effort: Pulmonary effort is normal.      Breath sounds: Normal breath sounds.   Abdominal:      General: There is no distension.      Palpations: Abdomen is soft.      Tenderness: There is no guarding.   Musculoskeletal:      Cervical back: Normal range of motion.      Comments: Shoulder: left  Deformity: None    ROM:  Forward Flexion: Active: 180   Passive: 180  ER (  0): Active: 45   Passive: 45  IR (0): Active: Behind the body to the level L1  Abduction: Active: 180   Passive: 180    Palpation:  AC tenderness: None  SC tenderness: None  Clavicle tenderness: None  Biceps tenderness: None  Left medial parascapular: Positive    Strength (0-5/5):  Deltoid - Anterior: 5/5  Deltoid - Posterior: 5/5  Deltoid - Mid: 5/5  Supraspinatus: 5/5  External rotation: 5/5  Internal rotation: 5/5    Rotator Cuff Exam:  Neer's sign: Negative  Hawkins' sign: Negative  Painful Arc: Negative  Lift-off sign / Belly Press: Negative    Biceps/Labrum/AC Exam:  Yergason's Test: Negative  Speed's Test: Negative  O'Brien's Sign: Negative  Cross-chest adduction: Negative    Instability:  Anterior apprehension: Negative  Relocation test: Negative  Posterior apprehension: Negative  Anterior drawer: Negative  Posterior  drawer: Negative  Sulcus sign: Negative    Neuro/Vascular:  Pulses intact, no edema, and neurologically intact    C-Spine:  Cervical motion: FROM without pain.  Cervical tenderness: None  Spurling's test: Negative      Skin:     General: Skin is warm.   Neurological:      General: No focal deficit present.      Mental Status: She is alert and oriented to person, place, and time.   Psychiatric:         Mood and Affect: Mood normal.         Behavior: Behavior normal.       Patient seen with preceptor) in Room 5    On this date 09/24/2024 I have spent 30 minutes reviewing previous notes, test results and face to face with the patient discussing the diagnosis and importance of compliance with the treatment plan as well as documenting on the day of the visit.      An electronic signature was used to authenticate this note.    --Acy Orsak Kirkwood Holter, MD

## 2024-09-24 NOTE — Progress Notes (Signed)
 PHYSICAL THERAPY - DAILY TREATMENT NOTE (updated 3/23)    Date: 09/24/2024        Patient Name:  Rebecca Lyons DOB:  Aug 04, 1986   Medical   Diagnosis:  Periscapular pain [M89.8X1] Treatment Diagnosis:  M54.6  THORACIC PAIN    Referral Source:  Sedrick Blase Moselle, MD Insurance:   Payor: MICHAELL / Plan: [employer] PPO / Product Type: *No Product type* /                   Patient DOB verified yes     Visit #   Current  / Total 6 22   Time   In / Out 830a 915a   Total Treatment Time 45   Total Timed Codes 45     SUBJECTIVE  If an interpreting service was utilized for treatment of this patient, the contents of this document represent the material reviewed with the patient via the interpreter.     Pain Level (0-10 scale): 7/10    Any medication changes, allergies to medications, adverse drug reactions, diagnosis change, or new procedure performed?: [x]  No    []  Yes (see summary sheet for update)  Medications: Verified on Patient Summary List    Subjective functional status/changes:       Patient reported that she sees MD today and will be starting gabapentin  soon for the nerve pain.    OBJECTIVE    Therapeutic Procedures:  Tx Min Billable or 1:1 Min (if diff from Tx Min) Procedure, Rationale, Specifics     97110 Therapeutic Exercise (timed):  increase ROM, strength, coordination, balance, and proprioception to improve patient's ability to progress to PLOF and address remaining functional goals. (see flow sheet as applicable)     Details if applicable:      97140 Manual Therapy (timed):  decrease pain, increase ROM, increase tissue extensibility, decrease trigger points, and increase postural awareness to improve patient's ability to progress to PLOF and address remaining functional goals.  The manual therapy interventions were performed at a separate and distinct time from the therapeutic activities interventions . (see flow sheet as applicable)     Details if applicable: gentle STM/CFM thoracic paraspinals, gr 1  P-A mob T6-12   45  97112 Neuromuscular Re-Education (timed):  improve balance, coordination, kinesthetic sense, posture, core stability and proprioception to improve patient's ability to develop conscious control of individual muscles and awareness of position of extremities in order to progress to PLOF and address remaining functional goals. (see flow sheet as applicable)    Details if applicable: pain science education and PRI intervention   45     Total Total     [x]   Patient Education billed concurrently with other procedures   [x]  Review HEP    []  Progressed/Changed HEP, detail:    []  Other detail:       Other Objective/Functional Measures   Unable to perform standing wall reach correctly    Pain Level at end of session (0-10 scale): 7    Assessment       Patient will continue to benefit from skilled PT / OT services to modify and progress therapeutic interventions, analyze and address functional mobility deficits, analyze and address ROM deficits, analyze and address strength deficits, analyze and address soft tissue restrictions, analyze and cue for proper movement patterns, analyze and modify for postural abnormalities, and instruct in home and community integration to address functional deficits and attain remaining goals.    Progress toward goals / Updated  goals:  []   See Progress Note/Recertification    Short Term Goals: To be accomplished in 4 treatments.- IN PROGRESS  Patient will be independent with initial HEP in order to transition to general wellness program.  Patient will report worst pain level of 6/10 or better to allow for a minimum of 10 minutes of driving.    The patient will report ability to unload groceries from car into home without an increase in pain within the last 5 days  The patient will demonstrate ability to lift 2 pounds overhead with pain level of 5/10 or less to improve ease of putting away dishes in kitchen      Long Term Goals: To be accomplished in 12 treatments.  Patient  will report worst pain no greater than 2/10 to increase QOL and allow for independence with all hygienic self-care and ADL skills   Patient will demonstrate 5/5 BUE strength to allow for home cleaning skills independently and without rest breaks needed  Patient will demonstrate pain free lumbar AROM WFL to improve ease with household chores like emptying the dishwasher     PLAN  Yes  Continue plan of care  Re-Cert Due: 12 visits  [x]   Upgrade activities as tolerated  []   Discharge due to:  []   Other:    Oswell Say VAN DECKER, PTA       09/24/2024       9:50 AM

## 2024-09-30 ENCOUNTER — Ambulatory Visit: Payer: PRIVATE HEALTH INSURANCE | Primary: Student in an Organized Health Care Education/Training Program

## 2024-09-30 NOTE — Progress Notes (Signed)
"  I saw and evaluated the patient, performing the key elements of the service.  I discussed the findings, assessment and plan with the resident and agree with the resident's findings and plan as documented in the resident's note.    "

## 2024-10-01 ENCOUNTER — Inpatient Hospital Stay
Admit: 2024-10-01 | Payer: PRIVATE HEALTH INSURANCE | Primary: Student in an Organized Health Care Education/Training Program

## 2024-10-01 NOTE — Progress Notes (Signed)
 PHYSICAL THERAPY - DAILY TREATMENT NOTE (updated 3/23)    Date: 10/01/2024        Patient Name:  Rebecca Lyons DOB:  08-08-1986   Medical   Diagnosis:  Periscapular pain [M89.8X1] Treatment Diagnosis:  M54.6  THORACIC PAIN    Referral Source:  Sedrick Blase Moselle, MD Insurance:   Payor: MICHAELL / Plan: [employer] PPO / Product Type: *No Product type* /                   Patient DOB verified yes     Visit #   Current  / Total 7 22   Time   In / Out 830a 900a   Total Treatment Time 30   Total Timed Codes 30     SUBJECTIVE  If an interpreting service was utilized for treatment of this patient, the contents of this document represent the material reviewed with the patient via the interpreter.     Pain Level (0-10 scale): 5/10    Any medication changes, allergies to medications, adverse drug reactions, diagnosis change, or new procedure performed?: [x]  No    []  Yes (see summary sheet for update)  Medications: Verified on Patient Summary List    Subjective functional status/changes:       Patient reported she received a steroid injection and that has helped with the nerve pain. It's still there but is able to do more before the pain increases.    OBJECTIVE    Therapeutic Procedures:  Tx Min Billable or 1:1 Min (if diff from Tx Min) Procedure, Rationale, Specifics     97110 Therapeutic Exercise (timed):  increase ROM, strength, coordination, balance, and proprioception to improve patient's ability to progress to PLOF and address remaining functional goals. (see flow sheet as applicable)     Details if applicable:      97140 Manual Therapy (timed):  decrease pain, increase ROM, increase tissue extensibility, decrease trigger points, and increase postural awareness to improve patient's ability to progress to PLOF and address remaining functional goals.  The manual therapy interventions were performed at a separate and distinct time from the therapeutic activities interventions . (see flow sheet as applicable)      Details if applicable: gentle STM/CFM thoracic paraspinals, gr 1 P-A mob T6-12   30  97112 Neuromuscular Re-Education (timed):  improve balance, coordination, kinesthetic sense, posture, core stability and proprioception to improve patient's ability to develop conscious control of individual muscles and awareness of position of extremities in order to progress to PLOF and address remaining functional goals. (see flow sheet as applicable)    Details if applicable: pain science education and PRI intervention   30     Total Total     [x]   Patient Education billed concurrently with other procedures   [x]  Review HEP    []  Progressed/Changed HEP, detail:    []  Other detail:       Other Objective/Functional Measures   Improvement in ability to expand posterior mediastinum    Pain Level at end of session (0-10 scale): 6    Assessment       Patient will continue to benefit from skilled PT / OT services to modify and progress therapeutic interventions, analyze and address functional mobility deficits, analyze and address ROM deficits, analyze and address strength deficits, analyze and address soft tissue restrictions, analyze and cue for proper movement patterns, analyze and modify for postural abnormalities, and instruct in home and community integration to address functional deficits and  attain remaining goals.    Progress toward goals / Updated goals:  []   See Progress Note/Recertification    Short Term Goals: To be accomplished in 4 treatments.- IN PROGRESS  Patient will be independent with initial HEP in order to transition to general wellness program.  Patient will report worst pain level of 6/10 or better to allow for a minimum of 10 minutes of driving.    The patient will report ability to unload groceries from car into home without an increase in pain within the last 5 days  The patient will demonstrate ability to lift 2 pounds overhead with pain level of 5/10 or less to improve ease of putting away dishes in  kitchen      Long Term Goals: To be accomplished in 12 treatments.  Patient will report worst pain no greater than 2/10 to increase QOL and allow for independence with all hygienic self-care and ADL skills   Patient will demonstrate 5/5 BUE strength to allow for home cleaning skills independently and without rest breaks needed  Patient will demonstrate pain free lumbar AROM WFL to improve ease with household chores like emptying the dishwasher     PLAN  Yes  Continue plan of care  Re-Cert Due: 12 visits  [x]   Upgrade activities as tolerated  []   Discharge due to:  []   Other:    Lark Runk VAN DECKER, PTA       10/01/2024       8:41 AM

## 2024-10-08 ENCOUNTER — Encounter: Payer: PRIVATE HEALTH INSURANCE | Primary: Student in an Organized Health Care Education/Training Program

## 2024-10-15 ENCOUNTER — Inpatient Hospital Stay: Payer: PRIVATE HEALTH INSURANCE | Primary: Student in an Organized Health Care Education/Training Program

## 2024-10-22 ENCOUNTER — Encounter: Payer: PRIVATE HEALTH INSURANCE | Primary: Student in an Organized Health Care Education/Training Program
